# Patient Record
Sex: Male | Born: 1950
Health system: Southern US, Community
[De-identification: ages and names within clinical notes are randomized; demographics above are authoritative.]

## PROBLEM LIST (undated history)

## (undated) DIAGNOSIS — B029 Zoster without complications: Secondary | ICD-10-CM

## (undated) DIAGNOSIS — E663 Overweight: Secondary | ICD-10-CM

## (undated) DIAGNOSIS — E785 Hyperlipidemia, unspecified: Secondary | ICD-10-CM

## (undated) DIAGNOSIS — G63 Polyneuropathy in diseases classified elsewhere: Secondary | ICD-10-CM

## (undated) DIAGNOSIS — Z6828 Body mass index (BMI) 28.0-28.9, adult: Secondary | ICD-10-CM

## (undated) DIAGNOSIS — E1169 Type 2 diabetes mellitus with other specified complication: Secondary | ICD-10-CM

## (undated) HISTORY — PX: BACK SURGERY: SHX140

## (undated) HISTORY — PX: CHOLECYSTECTOMY: SHX55

## (undated) HISTORY — PX: HERNIA REPAIR: SHX51

## (undated) HISTORY — DX: Overweight: E66.3

## (undated) HISTORY — DX: Zoster without complications: B02.9

## (undated) HISTORY — DX: Type 2 diabetes mellitus with other specified complication: E11.69

## (undated) HISTORY — DX: Hyperlipidemia, unspecified: E78.5

## (undated) HISTORY — DX: Body mass index (BMI) 28.0-28.9, adult: Z68.28

## (undated) HISTORY — DX: Polyneuropathy in diseases classified elsewhere: G63

---

## 2013-08-21 DIAGNOSIS — Z125 Encounter for screening for malignant neoplasm of prostate: Secondary | ICD-10-CM | POA: Diagnosis not present

## 2013-08-21 DIAGNOSIS — Z79899 Other long term (current) drug therapy: Secondary | ICD-10-CM | POA: Diagnosis not present

## 2013-08-21 DIAGNOSIS — I10 Essential (primary) hypertension: Secondary | ICD-10-CM | POA: Diagnosis not present

## 2013-08-21 DIAGNOSIS — E7800 Pure hypercholesterolemia, unspecified: Secondary | ICD-10-CM | POA: Diagnosis not present

## 2013-08-21 DIAGNOSIS — E119 Type 2 diabetes mellitus without complications: Secondary | ICD-10-CM | POA: Diagnosis not present

## 2014-01-08 DIAGNOSIS — H109 Unspecified conjunctivitis: Secondary | ICD-10-CM | POA: Diagnosis not present

## 2014-09-18 DIAGNOSIS — E119 Type 2 diabetes mellitus without complications: Secondary | ICD-10-CM | POA: Diagnosis not present

## 2014-09-18 DIAGNOSIS — E1169 Type 2 diabetes mellitus with other specified complication: Secondary | ICD-10-CM | POA: Diagnosis not present

## 2014-09-18 DIAGNOSIS — Z0000 Encounter for general adult medical examination without abnormal findings: Secondary | ICD-10-CM | POA: Diagnosis not present

## 2014-09-18 DIAGNOSIS — Z125 Encounter for screening for malignant neoplasm of prostate: Secondary | ICD-10-CM | POA: Diagnosis not present

## 2014-09-18 DIAGNOSIS — Z79899 Other long term (current) drug therapy: Secondary | ICD-10-CM | POA: Diagnosis not present

## 2014-09-18 DIAGNOSIS — I10 Essential (primary) hypertension: Secondary | ICD-10-CM | POA: Diagnosis not present

## 2014-09-18 DIAGNOSIS — Z23 Encounter for immunization: Secondary | ICD-10-CM | POA: Diagnosis not present

## 2014-09-18 DIAGNOSIS — E780 Pure hypercholesterolemia: Secondary | ICD-10-CM | POA: Diagnosis not present

## 2014-12-27 DIAGNOSIS — E780 Pure hypercholesterolemia: Secondary | ICD-10-CM | POA: Diagnosis not present

## 2014-12-27 DIAGNOSIS — E1169 Type 2 diabetes mellitus with other specified complication: Secondary | ICD-10-CM | POA: Diagnosis not present

## 2014-12-27 DIAGNOSIS — I10 Essential (primary) hypertension: Secondary | ICD-10-CM | POA: Diagnosis not present

## 2014-12-27 DIAGNOSIS — E1165 Type 2 diabetes mellitus with hyperglycemia: Secondary | ICD-10-CM | POA: Diagnosis not present

## 2015-05-13 DIAGNOSIS — E1169 Type 2 diabetes mellitus with other specified complication: Secondary | ICD-10-CM | POA: Diagnosis not present

## 2015-05-21 DIAGNOSIS — R531 Weakness: Secondary | ICD-10-CM | POA: Diagnosis not present

## 2015-05-21 DIAGNOSIS — E7800 Pure hypercholesterolemia, unspecified: Secondary | ICD-10-CM | POA: Diagnosis not present

## 2015-05-21 DIAGNOSIS — E1169 Type 2 diabetes mellitus with other specified complication: Secondary | ICD-10-CM | POA: Diagnosis not present

## 2015-05-21 DIAGNOSIS — I10 Essential (primary) hypertension: Secondary | ICD-10-CM | POA: Diagnosis not present

## 2015-05-21 DIAGNOSIS — E119 Type 2 diabetes mellitus without complications: Secondary | ICD-10-CM | POA: Diagnosis not present

## 2015-05-30 DIAGNOSIS — R0600 Dyspnea, unspecified: Secondary | ICD-10-CM | POA: Diagnosis not present

## 2015-08-29 DIAGNOSIS — E1165 Type 2 diabetes mellitus with hyperglycemia: Secondary | ICD-10-CM | POA: Diagnosis not present

## 2015-08-29 DIAGNOSIS — E785 Hyperlipidemia, unspecified: Secondary | ICD-10-CM | POA: Diagnosis not present

## 2015-08-29 DIAGNOSIS — R42 Dizziness and giddiness: Secondary | ICD-10-CM | POA: Diagnosis not present

## 2015-08-29 DIAGNOSIS — I10 Essential (primary) hypertension: Secondary | ICD-10-CM | POA: Diagnosis not present

## 2015-08-29 DIAGNOSIS — Z79899 Other long term (current) drug therapy: Secondary | ICD-10-CM | POA: Diagnosis not present

## 2015-10-28 DIAGNOSIS — E663 Overweight: Secondary | ICD-10-CM | POA: Diagnosis not present

## 2015-10-28 DIAGNOSIS — Z79899 Other long term (current) drug therapy: Secondary | ICD-10-CM | POA: Diagnosis not present

## 2015-10-28 DIAGNOSIS — E785 Hyperlipidemia, unspecified: Secondary | ICD-10-CM | POA: Diagnosis not present

## 2015-10-28 DIAGNOSIS — Z125 Encounter for screening for malignant neoplasm of prostate: Secondary | ICD-10-CM | POA: Diagnosis not present

## 2015-10-28 DIAGNOSIS — E1169 Type 2 diabetes mellitus with other specified complication: Secondary | ICD-10-CM | POA: Diagnosis not present

## 2015-10-28 DIAGNOSIS — R5383 Other fatigue: Secondary | ICD-10-CM | POA: Diagnosis not present

## 2015-10-28 DIAGNOSIS — D519 Vitamin B12 deficiency anemia, unspecified: Secondary | ICD-10-CM | POA: Diagnosis not present

## 2015-10-28 DIAGNOSIS — Z0000 Encounter for general adult medical examination without abnormal findings: Secondary | ICD-10-CM | POA: Diagnosis not present

## 2015-10-28 DIAGNOSIS — I10 Essential (primary) hypertension: Secondary | ICD-10-CM | POA: Diagnosis not present

## 2016-03-20 DIAGNOSIS — Z8719 Personal history of other diseases of the digestive system: Secondary | ICD-10-CM | POA: Diagnosis not present

## 2016-03-20 DIAGNOSIS — Z9889 Other specified postprocedural states: Secondary | ICD-10-CM | POA: Diagnosis not present

## 2016-03-20 DIAGNOSIS — Z23 Encounter for immunization: Secondary | ICD-10-CM | POA: Diagnosis not present

## 2016-03-20 DIAGNOSIS — R1032 Left lower quadrant pain: Secondary | ICD-10-CM | POA: Diagnosis not present

## 2016-03-20 DIAGNOSIS — M1711 Unilateral primary osteoarthritis, right knee: Secondary | ICD-10-CM | POA: Diagnosis not present

## 2016-04-06 DIAGNOSIS — R1032 Left lower quadrant pain: Secondary | ICD-10-CM | POA: Diagnosis not present

## 2016-04-06 HISTORY — DX: Left lower quadrant pain: R10.32

## 2016-06-11 DIAGNOSIS — R1032 Left lower quadrant pain: Secondary | ICD-10-CM | POA: Diagnosis not present

## 2016-06-11 DIAGNOSIS — Z1389 Encounter for screening for other disorder: Secondary | ICD-10-CM | POA: Diagnosis not present

## 2016-06-11 DIAGNOSIS — E785 Hyperlipidemia, unspecified: Secondary | ICD-10-CM | POA: Diagnosis not present

## 2016-06-11 DIAGNOSIS — Z9181 History of falling: Secondary | ICD-10-CM | POA: Diagnosis not present

## 2016-06-18 DIAGNOSIS — M5136 Other intervertebral disc degeneration, lumbar region: Secondary | ICD-10-CM | POA: Diagnosis not present

## 2016-06-18 DIAGNOSIS — R933 Abnormal findings on diagnostic imaging of other parts of digestive tract: Secondary | ICD-10-CM | POA: Diagnosis not present

## 2016-06-18 DIAGNOSIS — I700 Atherosclerosis of aorta: Secondary | ICD-10-CM | POA: Diagnosis not present

## 2016-06-18 DIAGNOSIS — R1032 Left lower quadrant pain: Secondary | ICD-10-CM | POA: Diagnosis not present

## 2016-06-18 DIAGNOSIS — K5730 Diverticulosis of large intestine without perforation or abscess without bleeding: Secondary | ICD-10-CM | POA: Diagnosis not present

## 2016-06-18 DIAGNOSIS — I2510 Atherosclerotic heart disease of native coronary artery without angina pectoris: Secondary | ICD-10-CM | POA: Diagnosis not present

## 2016-06-18 DIAGNOSIS — N200 Calculus of kidney: Secondary | ICD-10-CM | POA: Diagnosis not present

## 2016-06-18 DIAGNOSIS — M47896 Other spondylosis, lumbar region: Secondary | ICD-10-CM | POA: Diagnosis not present

## 2016-06-23 DIAGNOSIS — K6389 Other specified diseases of intestine: Secondary | ICD-10-CM

## 2016-06-23 DIAGNOSIS — K639 Disease of intestine, unspecified: Secondary | ICD-10-CM | POA: Diagnosis not present

## 2016-06-23 DIAGNOSIS — R1032 Left lower quadrant pain: Secondary | ICD-10-CM | POA: Diagnosis not present

## 2016-06-23 HISTORY — DX: Other specified diseases of intestine: K63.89

## 2016-06-25 DIAGNOSIS — Z9049 Acquired absence of other specified parts of digestive tract: Secondary | ICD-10-CM | POA: Diagnosis not present

## 2016-06-25 DIAGNOSIS — Z87891 Personal history of nicotine dependence: Secondary | ICD-10-CM | POA: Diagnosis not present

## 2016-06-25 DIAGNOSIS — Z7982 Long term (current) use of aspirin: Secondary | ICD-10-CM | POA: Diagnosis not present

## 2016-06-25 DIAGNOSIS — E7800 Pure hypercholesterolemia, unspecified: Secondary | ICD-10-CM | POA: Diagnosis not present

## 2016-06-25 DIAGNOSIS — I10 Essential (primary) hypertension: Secondary | ICD-10-CM | POA: Diagnosis not present

## 2016-06-25 DIAGNOSIS — Z79899 Other long term (current) drug therapy: Secondary | ICD-10-CM | POA: Diagnosis not present

## 2016-06-25 DIAGNOSIS — C184 Malignant neoplasm of transverse colon: Secondary | ICD-10-CM | POA: Diagnosis not present

## 2016-06-25 DIAGNOSIS — K5730 Diverticulosis of large intestine without perforation or abscess without bleeding: Secondary | ICD-10-CM | POA: Diagnosis not present

## 2016-06-25 DIAGNOSIS — C189 Malignant neoplasm of colon, unspecified: Secondary | ICD-10-CM | POA: Diagnosis not present

## 2016-06-25 DIAGNOSIS — E119 Type 2 diabetes mellitus without complications: Secondary | ICD-10-CM | POA: Diagnosis not present

## 2016-06-25 DIAGNOSIS — Z7984 Long term (current) use of oral hypoglycemic drugs: Secondary | ICD-10-CM | POA: Diagnosis not present

## 2016-07-01 DIAGNOSIS — D500 Iron deficiency anemia secondary to blood loss (chronic): Secondary | ICD-10-CM | POA: Insufficient documentation

## 2016-07-01 HISTORY — DX: Iron deficiency anemia secondary to blood loss (chronic): D50.0

## 2016-07-07 DIAGNOSIS — C189 Malignant neoplasm of colon, unspecified: Secondary | ICD-10-CM | POA: Diagnosis not present

## 2016-07-07 DIAGNOSIS — C184 Malignant neoplasm of transverse colon: Secondary | ICD-10-CM

## 2016-07-07 HISTORY — DX: Malignant neoplasm of transverse colon: C18.4

## 2016-07-09 DIAGNOSIS — R918 Other nonspecific abnormal finding of lung field: Secondary | ICD-10-CM | POA: Diagnosis not present

## 2016-07-09 DIAGNOSIS — C189 Malignant neoplasm of colon, unspecified: Secondary | ICD-10-CM | POA: Diagnosis not present

## 2016-07-15 DIAGNOSIS — E785 Hyperlipidemia, unspecified: Secondary | ICD-10-CM | POA: Diagnosis present

## 2016-07-15 DIAGNOSIS — R410 Disorientation, unspecified: Secondary | ICD-10-CM | POA: Diagnosis present

## 2016-07-15 DIAGNOSIS — C772 Secondary and unspecified malignant neoplasm of intra-abdominal lymph nodes: Secondary | ICD-10-CM | POA: Diagnosis not present

## 2016-07-15 DIAGNOSIS — C184 Malignant neoplasm of transverse colon: Secondary | ICD-10-CM | POA: Diagnosis not present

## 2016-07-15 DIAGNOSIS — Z79899 Other long term (current) drug therapy: Secondary | ICD-10-CM | POA: Diagnosis not present

## 2016-07-15 DIAGNOSIS — Z4682 Encounter for fitting and adjustment of non-vascular catheter: Secondary | ICD-10-CM | POA: Diagnosis not present

## 2016-07-15 DIAGNOSIS — Z87891 Personal history of nicotine dependence: Secondary | ICD-10-CM | POA: Diagnosis not present

## 2016-07-15 DIAGNOSIS — Z01810 Encounter for preprocedural cardiovascular examination: Secondary | ICD-10-CM | POA: Diagnosis not present

## 2016-07-15 DIAGNOSIS — E119 Type 2 diabetes mellitus without complications: Secondary | ICD-10-CM | POA: Diagnosis not present

## 2016-07-15 DIAGNOSIS — T426X5A Adverse effect of other antiepileptic and sedative-hypnotic drugs, initial encounter: Secondary | ICD-10-CM | POA: Diagnosis present

## 2016-07-15 DIAGNOSIS — C183 Malignant neoplasm of hepatic flexure: Secondary | ICD-10-CM | POA: Diagnosis not present

## 2016-07-15 DIAGNOSIS — K567 Ileus, unspecified: Secondary | ICD-10-CM | POA: Diagnosis not present

## 2016-07-15 DIAGNOSIS — I10 Essential (primary) hypertension: Secondary | ICD-10-CM | POA: Diagnosis not present

## 2016-07-15 DIAGNOSIS — K219 Gastro-esophageal reflux disease without esophagitis: Secondary | ICD-10-CM | POA: Diagnosis present

## 2016-07-15 DIAGNOSIS — Z7982 Long term (current) use of aspirin: Secondary | ICD-10-CM | POA: Diagnosis not present

## 2016-07-15 DIAGNOSIS — Z7984 Long term (current) use of oral hypoglycemic drugs: Secondary | ICD-10-CM | POA: Diagnosis not present

## 2016-07-23 DIAGNOSIS — M6281 Muscle weakness (generalized): Secondary | ICD-10-CM | POA: Diagnosis not present

## 2016-07-23 DIAGNOSIS — E860 Dehydration: Secondary | ICD-10-CM | POA: Diagnosis not present

## 2016-07-23 DIAGNOSIS — R197 Diarrhea, unspecified: Secondary | ICD-10-CM | POA: Diagnosis not present

## 2016-07-28 DIAGNOSIS — Z09 Encounter for follow-up examination after completed treatment for conditions other than malignant neoplasm: Secondary | ICD-10-CM

## 2016-07-28 HISTORY — DX: Encounter for follow-up examination after completed treatment for conditions other than malignant neoplasm: Z09

## 2016-08-07 DIAGNOSIS — C189 Malignant neoplasm of colon, unspecified: Secondary | ICD-10-CM | POA: Diagnosis not present

## 2016-08-07 DIAGNOSIS — C184 Malignant neoplasm of transverse colon: Secondary | ICD-10-CM | POA: Diagnosis not present

## 2016-08-07 DIAGNOSIS — D509 Iron deficiency anemia, unspecified: Secondary | ICD-10-CM | POA: Diagnosis not present

## 2016-08-11 DIAGNOSIS — C184 Malignant neoplasm of transverse colon: Secondary | ICD-10-CM | POA: Diagnosis not present

## 2016-08-14 DIAGNOSIS — Z7984 Long term (current) use of oral hypoglycemic drugs: Secondary | ICD-10-CM | POA: Diagnosis not present

## 2016-08-14 DIAGNOSIS — E119 Type 2 diabetes mellitus without complications: Secondary | ICD-10-CM | POA: Diagnosis not present

## 2016-08-14 DIAGNOSIS — I10 Essential (primary) hypertension: Secondary | ICD-10-CM | POA: Diagnosis not present

## 2016-08-14 DIAGNOSIS — C183 Malignant neoplasm of hepatic flexure: Secondary | ICD-10-CM | POA: Diagnosis not present

## 2016-08-14 DIAGNOSIS — C184 Malignant neoplasm of transverse colon: Secondary | ICD-10-CM | POA: Diagnosis not present

## 2016-08-14 DIAGNOSIS — C189 Malignant neoplasm of colon, unspecified: Secondary | ICD-10-CM | POA: Diagnosis not present

## 2016-08-14 DIAGNOSIS — Z452 Encounter for adjustment and management of vascular access device: Secondary | ICD-10-CM | POA: Diagnosis not present

## 2016-08-14 DIAGNOSIS — Z79899 Other long term (current) drug therapy: Secondary | ICD-10-CM | POA: Diagnosis not present

## 2016-08-19 DIAGNOSIS — Z5111 Encounter for antineoplastic chemotherapy: Secondary | ICD-10-CM | POA: Diagnosis not present

## 2016-08-19 DIAGNOSIS — C184 Malignant neoplasm of transverse colon: Secondary | ICD-10-CM | POA: Diagnosis not present

## 2016-08-21 DIAGNOSIS — Z452 Encounter for adjustment and management of vascular access device: Secondary | ICD-10-CM | POA: Diagnosis not present

## 2016-08-21 DIAGNOSIS — C184 Malignant neoplasm of transverse colon: Secondary | ICD-10-CM | POA: Diagnosis not present

## 2016-08-26 DIAGNOSIS — C184 Malignant neoplasm of transverse colon: Secondary | ICD-10-CM | POA: Diagnosis not present

## 2016-09-02 DIAGNOSIS — D649 Anemia, unspecified: Secondary | ICD-10-CM | POA: Diagnosis not present

## 2016-09-02 DIAGNOSIS — C184 Malignant neoplasm of transverse colon: Secondary | ICD-10-CM | POA: Diagnosis not present

## 2016-09-02 DIAGNOSIS — D500 Iron deficiency anemia secondary to blood loss (chronic): Secondary | ICD-10-CM | POA: Diagnosis not present

## 2016-09-02 DIAGNOSIS — C189 Malignant neoplasm of colon, unspecified: Secondary | ICD-10-CM | POA: Diagnosis not present

## 2016-09-04 DIAGNOSIS — Z452 Encounter for adjustment and management of vascular access device: Secondary | ICD-10-CM | POA: Diagnosis not present

## 2016-09-04 DIAGNOSIS — C184 Malignant neoplasm of transverse colon: Secondary | ICD-10-CM | POA: Diagnosis not present

## 2016-09-15 DIAGNOSIS — R197 Diarrhea, unspecified: Secondary | ICD-10-CM | POA: Diagnosis not present

## 2016-09-15 DIAGNOSIS — C779 Secondary and unspecified malignant neoplasm of lymph node, unspecified: Secondary | ICD-10-CM | POA: Diagnosis not present

## 2016-09-15 DIAGNOSIS — R109 Unspecified abdominal pain: Secondary | ICD-10-CM | POA: Diagnosis not present

## 2016-09-15 DIAGNOSIS — C184 Malignant neoplasm of transverse colon: Secondary | ICD-10-CM | POA: Diagnosis not present

## 2016-09-15 DIAGNOSIS — C182 Malignant neoplasm of ascending colon: Secondary | ICD-10-CM | POA: Diagnosis not present

## 2016-09-15 DIAGNOSIS — C786 Secondary malignant neoplasm of retroperitoneum and peritoneum: Secondary | ICD-10-CM | POA: Diagnosis not present

## 2016-09-15 DIAGNOSIS — R110 Nausea: Secondary | ICD-10-CM | POA: Diagnosis not present

## 2016-09-17 DIAGNOSIS — D500 Iron deficiency anemia secondary to blood loss (chronic): Secondary | ICD-10-CM | POA: Diagnosis not present

## 2016-09-17 DIAGNOSIS — C184 Malignant neoplasm of transverse colon: Secondary | ICD-10-CM | POA: Diagnosis not present

## 2016-09-17 DIAGNOSIS — Z452 Encounter for adjustment and management of vascular access device: Secondary | ICD-10-CM | POA: Diagnosis not present

## 2016-09-30 DIAGNOSIS — C778 Secondary and unspecified malignant neoplasm of lymph nodes of multiple regions: Secondary | ICD-10-CM | POA: Diagnosis not present

## 2016-09-30 DIAGNOSIS — C184 Malignant neoplasm of transverse colon: Secondary | ICD-10-CM | POA: Diagnosis not present

## 2016-10-02 DIAGNOSIS — C184 Malignant neoplasm of transverse colon: Secondary | ICD-10-CM | POA: Diagnosis not present

## 2016-10-02 DIAGNOSIS — C778 Secondary and unspecified malignant neoplasm of lymph nodes of multiple regions: Secondary | ICD-10-CM | POA: Diagnosis not present

## 2016-10-02 DIAGNOSIS — Z452 Encounter for adjustment and management of vascular access device: Secondary | ICD-10-CM | POA: Diagnosis not present

## 2016-10-14 DIAGNOSIS — C184 Malignant neoplasm of transverse colon: Secondary | ICD-10-CM | POA: Diagnosis not present

## 2016-10-16 DIAGNOSIS — C184 Malignant neoplasm of transverse colon: Secondary | ICD-10-CM | POA: Diagnosis not present

## 2016-10-16 DIAGNOSIS — Z452 Encounter for adjustment and management of vascular access device: Secondary | ICD-10-CM | POA: Diagnosis not present

## 2016-10-28 DIAGNOSIS — D509 Iron deficiency anemia, unspecified: Secondary | ICD-10-CM | POA: Diagnosis not present

## 2016-10-28 DIAGNOSIS — C184 Malignant neoplasm of transverse colon: Secondary | ICD-10-CM | POA: Diagnosis not present

## 2016-10-30 DIAGNOSIS — D509 Iron deficiency anemia, unspecified: Secondary | ICD-10-CM | POA: Diagnosis not present

## 2016-10-30 DIAGNOSIS — Z452 Encounter for adjustment and management of vascular access device: Secondary | ICD-10-CM | POA: Diagnosis not present

## 2016-10-30 DIAGNOSIS — C184 Malignant neoplasm of transverse colon: Secondary | ICD-10-CM | POA: Diagnosis not present

## 2016-11-06 DIAGNOSIS — Z23 Encounter for immunization: Secondary | ICD-10-CM | POA: Diagnosis not present

## 2016-11-06 DIAGNOSIS — Z125 Encounter for screening for malignant neoplasm of prostate: Secondary | ICD-10-CM | POA: Diagnosis not present

## 2016-11-06 DIAGNOSIS — Z0000 Encounter for general adult medical examination without abnormal findings: Secondary | ICD-10-CM | POA: Diagnosis not present

## 2016-11-06 DIAGNOSIS — E1169 Type 2 diabetes mellitus with other specified complication: Secondary | ICD-10-CM | POA: Diagnosis not present

## 2016-11-06 DIAGNOSIS — Z79899 Other long term (current) drug therapy: Secondary | ICD-10-CM | POA: Diagnosis not present

## 2016-11-06 DIAGNOSIS — I10 Essential (primary) hypertension: Secondary | ICD-10-CM | POA: Diagnosis not present

## 2016-11-06 DIAGNOSIS — E785 Hyperlipidemia, unspecified: Secondary | ICD-10-CM | POA: Diagnosis not present

## 2016-11-06 DIAGNOSIS — C189 Malignant neoplasm of colon, unspecified: Secondary | ICD-10-CM | POA: Diagnosis not present

## 2016-11-11 DIAGNOSIS — C184 Malignant neoplasm of transverse colon: Secondary | ICD-10-CM | POA: Diagnosis not present

## 2016-11-11 DIAGNOSIS — C189 Malignant neoplasm of colon, unspecified: Secondary | ICD-10-CM | POA: Diagnosis not present

## 2016-11-11 DIAGNOSIS — D6959 Other secondary thrombocytopenia: Secondary | ICD-10-CM | POA: Diagnosis not present

## 2016-11-18 DIAGNOSIS — Z5111 Encounter for antineoplastic chemotherapy: Secondary | ICD-10-CM | POA: Diagnosis not present

## 2016-11-18 DIAGNOSIS — C184 Malignant neoplasm of transverse colon: Secondary | ICD-10-CM | POA: Diagnosis not present

## 2016-11-18 DIAGNOSIS — D500 Iron deficiency anemia secondary to blood loss (chronic): Secondary | ICD-10-CM | POA: Diagnosis not present

## 2016-11-20 DIAGNOSIS — D500 Iron deficiency anemia secondary to blood loss (chronic): Secondary | ICD-10-CM | POA: Diagnosis not present

## 2016-11-20 DIAGNOSIS — C184 Malignant neoplasm of transverse colon: Secondary | ICD-10-CM | POA: Diagnosis not present

## 2016-11-20 DIAGNOSIS — Z452 Encounter for adjustment and management of vascular access device: Secondary | ICD-10-CM | POA: Diagnosis not present

## 2016-12-02 DIAGNOSIS — C184 Malignant neoplasm of transverse colon: Secondary | ICD-10-CM | POA: Diagnosis not present

## 2016-12-04 DIAGNOSIS — Z452 Encounter for adjustment and management of vascular access device: Secondary | ICD-10-CM | POA: Diagnosis not present

## 2016-12-04 DIAGNOSIS — C184 Malignant neoplasm of transverse colon: Secondary | ICD-10-CM | POA: Diagnosis not present

## 2016-12-15 DIAGNOSIS — C184 Malignant neoplasm of transverse colon: Secondary | ICD-10-CM | POA: Diagnosis not present

## 2016-12-30 DIAGNOSIS — D61810 Antineoplastic chemotherapy induced pancytopenia: Secondary | ICD-10-CM | POA: Diagnosis not present

## 2016-12-30 DIAGNOSIS — D708 Other neutropenia: Secondary | ICD-10-CM | POA: Diagnosis not present

## 2016-12-30 DIAGNOSIS — C189 Malignant neoplasm of colon, unspecified: Secondary | ICD-10-CM | POA: Diagnosis not present

## 2016-12-30 DIAGNOSIS — D649 Anemia, unspecified: Secondary | ICD-10-CM | POA: Diagnosis not present

## 2016-12-30 DIAGNOSIS — C184 Malignant neoplasm of transverse colon: Secondary | ICD-10-CM | POA: Diagnosis not present

## 2017-01-01 DIAGNOSIS — C184 Malignant neoplasm of transverse colon: Secondary | ICD-10-CM | POA: Diagnosis not present

## 2017-01-01 DIAGNOSIS — Z452 Encounter for adjustment and management of vascular access device: Secondary | ICD-10-CM | POA: Diagnosis not present

## 2017-01-13 DIAGNOSIS — C184 Malignant neoplasm of transverse colon: Secondary | ICD-10-CM | POA: Diagnosis not present

## 2017-01-15 DIAGNOSIS — Z452 Encounter for adjustment and management of vascular access device: Secondary | ICD-10-CM | POA: Diagnosis not present

## 2017-01-15 DIAGNOSIS — C184 Malignant neoplasm of transverse colon: Secondary | ICD-10-CM | POA: Diagnosis not present

## 2017-01-26 DIAGNOSIS — D696 Thrombocytopenia, unspecified: Secondary | ICD-10-CM | POA: Diagnosis not present

## 2017-01-26 DIAGNOSIS — G620 Drug-induced polyneuropathy: Secondary | ICD-10-CM | POA: Diagnosis not present

## 2017-01-26 DIAGNOSIS — C184 Malignant neoplasm of transverse colon: Secondary | ICD-10-CM | POA: Diagnosis not present

## 2017-01-26 DIAGNOSIS — C189 Malignant neoplasm of colon, unspecified: Secondary | ICD-10-CM | POA: Diagnosis not present

## 2017-01-26 DIAGNOSIS — D72818 Other decreased white blood cell count: Secondary | ICD-10-CM | POA: Diagnosis not present

## 2017-01-26 DIAGNOSIS — T451X5A Adverse effect of antineoplastic and immunosuppressive drugs, initial encounter: Secondary | ICD-10-CM | POA: Diagnosis not present

## 2017-01-26 DIAGNOSIS — D701 Agranulocytosis secondary to cancer chemotherapy: Secondary | ICD-10-CM | POA: Diagnosis not present

## 2017-01-28 DIAGNOSIS — C184 Malignant neoplasm of transverse colon: Secondary | ICD-10-CM | POA: Diagnosis not present

## 2017-01-28 DIAGNOSIS — Z452 Encounter for adjustment and management of vascular access device: Secondary | ICD-10-CM | POA: Diagnosis not present

## 2017-02-23 DIAGNOSIS — Z85038 Personal history of other malignant neoplasm of large intestine: Secondary | ICD-10-CM | POA: Diagnosis not present

## 2017-02-23 DIAGNOSIS — D72819 Decreased white blood cell count, unspecified: Secondary | ICD-10-CM | POA: Diagnosis not present

## 2017-02-23 DIAGNOSIS — G620 Drug-induced polyneuropathy: Secondary | ICD-10-CM | POA: Diagnosis not present

## 2017-02-23 DIAGNOSIS — D509 Iron deficiency anemia, unspecified: Secondary | ICD-10-CM | POA: Diagnosis not present

## 2017-02-23 DIAGNOSIS — D696 Thrombocytopenia, unspecified: Secondary | ICD-10-CM | POA: Diagnosis not present

## 2017-03-25 DIAGNOSIS — Z23 Encounter for immunization: Secondary | ICD-10-CM | POA: Diagnosis not present

## 2017-03-25 DIAGNOSIS — D696 Thrombocytopenia, unspecified: Secondary | ICD-10-CM | POA: Diagnosis not present

## 2017-03-25 DIAGNOSIS — D509 Iron deficiency anemia, unspecified: Secondary | ICD-10-CM | POA: Diagnosis not present

## 2017-03-25 DIAGNOSIS — E611 Iron deficiency: Secondary | ICD-10-CM | POA: Diagnosis not present

## 2017-03-25 DIAGNOSIS — Z85038 Personal history of other malignant neoplasm of large intestine: Secondary | ICD-10-CM | POA: Diagnosis not present

## 2017-03-25 DIAGNOSIS — T451X5A Adverse effect of antineoplastic and immunosuppressive drugs, initial encounter: Secondary | ICD-10-CM | POA: Diagnosis not present

## 2017-03-25 DIAGNOSIS — G620 Drug-induced polyneuropathy: Secondary | ICD-10-CM | POA: Diagnosis not present

## 2017-04-26 DIAGNOSIS — E1169 Type 2 diabetes mellitus with other specified complication: Secondary | ICD-10-CM | POA: Diagnosis not present

## 2017-04-26 DIAGNOSIS — E785 Hyperlipidemia, unspecified: Secondary | ICD-10-CM | POA: Diagnosis not present

## 2017-04-26 DIAGNOSIS — R600 Localized edema: Secondary | ICD-10-CM | POA: Diagnosis not present

## 2017-04-26 DIAGNOSIS — R0600 Dyspnea, unspecified: Secondary | ICD-10-CM | POA: Diagnosis not present

## 2017-04-30 DIAGNOSIS — R0600 Dyspnea, unspecified: Secondary | ICD-10-CM | POA: Diagnosis not present

## 2017-04-30 DIAGNOSIS — R0602 Shortness of breath: Secondary | ICD-10-CM | POA: Diagnosis not present

## 2017-06-25 DIAGNOSIS — C184 Malignant neoplasm of transverse colon: Secondary | ICD-10-CM | POA: Diagnosis not present

## 2017-06-25 DIAGNOSIS — M1712 Unilateral primary osteoarthritis, left knee: Secondary | ICD-10-CM | POA: Diagnosis not present

## 2017-06-25 DIAGNOSIS — M25562 Pain in left knee: Secondary | ICD-10-CM | POA: Diagnosis not present

## 2017-06-28 DIAGNOSIS — C184 Malignant neoplasm of transverse colon: Secondary | ICD-10-CM | POA: Diagnosis not present

## 2017-07-06 DIAGNOSIS — Z01818 Encounter for other preprocedural examination: Secondary | ICD-10-CM | POA: Diagnosis not present

## 2017-07-06 DIAGNOSIS — E663 Overweight: Secondary | ICD-10-CM | POA: Diagnosis not present

## 2017-07-06 DIAGNOSIS — Z6829 Body mass index (BMI) 29.0-29.9, adult: Secondary | ICD-10-CM | POA: Diagnosis not present

## 2017-07-06 DIAGNOSIS — M1712 Unilateral primary osteoarthritis, left knee: Secondary | ICD-10-CM | POA: Diagnosis not present

## 2017-07-08 DIAGNOSIS — Z7984 Long term (current) use of oral hypoglycemic drugs: Secondary | ICD-10-CM | POA: Diagnosis not present

## 2017-07-08 DIAGNOSIS — K5730 Diverticulosis of large intestine without perforation or abscess without bleeding: Secondary | ICD-10-CM | POA: Diagnosis not present

## 2017-07-08 DIAGNOSIS — I10 Essential (primary) hypertension: Secondary | ICD-10-CM | POA: Diagnosis not present

## 2017-07-08 DIAGNOSIS — E119 Type 2 diabetes mellitus without complications: Secondary | ICD-10-CM | POA: Diagnosis not present

## 2017-07-08 DIAGNOSIS — Z980 Intestinal bypass and anastomosis status: Secondary | ICD-10-CM | POA: Diagnosis not present

## 2017-07-08 DIAGNOSIS — Z87891 Personal history of nicotine dependence: Secondary | ICD-10-CM | POA: Diagnosis not present

## 2017-07-08 DIAGNOSIS — E7800 Pure hypercholesterolemia, unspecified: Secondary | ICD-10-CM | POA: Diagnosis not present

## 2017-07-08 DIAGNOSIS — Z79899 Other long term (current) drug therapy: Secondary | ICD-10-CM | POA: Diagnosis not present

## 2017-07-08 DIAGNOSIS — Z7982 Long term (current) use of aspirin: Secondary | ICD-10-CM | POA: Diagnosis not present

## 2017-07-08 DIAGNOSIS — C184 Malignant neoplasm of transverse colon: Secondary | ICD-10-CM | POA: Diagnosis not present

## 2017-07-14 DIAGNOSIS — Z79899 Other long term (current) drug therapy: Secondary | ICD-10-CM | POA: Diagnosis not present

## 2017-07-14 DIAGNOSIS — Z01818 Encounter for other preprocedural examination: Secondary | ICD-10-CM | POA: Diagnosis not present

## 2017-07-14 DIAGNOSIS — M79609 Pain in unspecified limb: Secondary | ICD-10-CM | POA: Diagnosis not present

## 2017-07-14 DIAGNOSIS — R52 Pain, unspecified: Secondary | ICD-10-CM | POA: Diagnosis not present

## 2017-08-17 DIAGNOSIS — M1712 Unilateral primary osteoarthritis, left knee: Secondary | ICD-10-CM | POA: Diagnosis not present

## 2017-08-17 DIAGNOSIS — Z452 Encounter for adjustment and management of vascular access device: Secondary | ICD-10-CM | POA: Diagnosis not present

## 2017-08-17 DIAGNOSIS — C184 Malignant neoplasm of transverse colon: Secondary | ICD-10-CM | POA: Diagnosis not present

## 2017-08-17 HISTORY — DX: Encounter for adjustment and management of vascular access device: Z45.2

## 2017-08-23 DIAGNOSIS — M1711 Unilateral primary osteoarthritis, right knee: Secondary | ICD-10-CM | POA: Diagnosis not present

## 2017-08-27 DIAGNOSIS — Z79899 Other long term (current) drug therapy: Secondary | ICD-10-CM | POA: Diagnosis not present

## 2017-08-27 DIAGNOSIS — Z87891 Personal history of nicotine dependence: Secondary | ICD-10-CM | POA: Diagnosis not present

## 2017-08-27 DIAGNOSIS — Z7982 Long term (current) use of aspirin: Secondary | ICD-10-CM | POA: Diagnosis not present

## 2017-08-27 DIAGNOSIS — Z7984 Long term (current) use of oral hypoglycemic drugs: Secondary | ICD-10-CM | POA: Diagnosis not present

## 2017-08-27 DIAGNOSIS — Z85038 Personal history of other malignant neoplasm of large intestine: Secondary | ICD-10-CM | POA: Diagnosis not present

## 2017-08-27 DIAGNOSIS — Z452 Encounter for adjustment and management of vascular access device: Secondary | ICD-10-CM | POA: Diagnosis not present

## 2017-08-27 DIAGNOSIS — I2510 Atherosclerotic heart disease of native coronary artery without angina pectoris: Secondary | ICD-10-CM | POA: Diagnosis not present

## 2017-08-27 DIAGNOSIS — E119 Type 2 diabetes mellitus without complications: Secondary | ICD-10-CM | POA: Diagnosis not present

## 2017-08-27 DIAGNOSIS — E7800 Pure hypercholesterolemia, unspecified: Secondary | ICD-10-CM | POA: Diagnosis not present

## 2017-08-27 DIAGNOSIS — I10 Essential (primary) hypertension: Secondary | ICD-10-CM | POA: Diagnosis not present

## 2017-09-16 DIAGNOSIS — E785 Hyperlipidemia, unspecified: Secondary | ICD-10-CM | POA: Diagnosis not present

## 2017-09-16 DIAGNOSIS — E1169 Type 2 diabetes mellitus with other specified complication: Secondary | ICD-10-CM | POA: Diagnosis not present

## 2017-09-16 DIAGNOSIS — I10 Essential (primary) hypertension: Secondary | ICD-10-CM | POA: Diagnosis not present

## 2017-09-16 DIAGNOSIS — Z79899 Other long term (current) drug therapy: Secondary | ICD-10-CM | POA: Diagnosis not present

## 2017-09-16 DIAGNOSIS — N401 Enlarged prostate with lower urinary tract symptoms: Secondary | ICD-10-CM | POA: Diagnosis not present

## 2017-09-23 DIAGNOSIS — C184 Malignant neoplasm of transverse colon: Secondary | ICD-10-CM | POA: Diagnosis not present

## 2017-09-23 DIAGNOSIS — R970 Elevated carcinoembryonic antigen [CEA]: Secondary | ICD-10-CM | POA: Diagnosis not present

## 2017-09-23 DIAGNOSIS — D696 Thrombocytopenia, unspecified: Secondary | ICD-10-CM | POA: Diagnosis not present

## 2017-09-23 DIAGNOSIS — Z85038 Personal history of other malignant neoplasm of large intestine: Secondary | ICD-10-CM | POA: Diagnosis not present

## 2017-09-23 DIAGNOSIS — Z9221 Personal history of antineoplastic chemotherapy: Secondary | ICD-10-CM | POA: Diagnosis not present

## 2017-09-23 DIAGNOSIS — G620 Drug-induced polyneuropathy: Secondary | ICD-10-CM | POA: Diagnosis not present

## 2017-10-19 DIAGNOSIS — C184 Malignant neoplasm of transverse colon: Secondary | ICD-10-CM | POA: Diagnosis not present

## 2017-10-19 DIAGNOSIS — K5730 Diverticulosis of large intestine without perforation or abscess without bleeding: Secondary | ICD-10-CM | POA: Diagnosis not present

## 2017-10-19 DIAGNOSIS — C189 Malignant neoplasm of colon, unspecified: Secondary | ICD-10-CM | POA: Diagnosis not present

## 2017-10-19 DIAGNOSIS — R970 Elevated carcinoembryonic antigen [CEA]: Secondary | ICD-10-CM | POA: Diagnosis not present

## 2017-10-19 DIAGNOSIS — Z9049 Acquired absence of other specified parts of digestive tract: Secondary | ICD-10-CM | POA: Diagnosis not present

## 2017-10-19 DIAGNOSIS — I700 Atherosclerosis of aorta: Secondary | ICD-10-CM | POA: Diagnosis not present

## 2017-11-15 DIAGNOSIS — E785 Hyperlipidemia, unspecified: Secondary | ICD-10-CM | POA: Diagnosis not present

## 2017-11-15 DIAGNOSIS — I10 Essential (primary) hypertension: Secondary | ICD-10-CM | POA: Diagnosis not present

## 2017-11-15 DIAGNOSIS — E1169 Type 2 diabetes mellitus with other specified complication: Secondary | ICD-10-CM | POA: Diagnosis not present

## 2017-11-15 DIAGNOSIS — N401 Enlarged prostate with lower urinary tract symptoms: Secondary | ICD-10-CM | POA: Diagnosis not present

## 2017-11-15 DIAGNOSIS — Z0000 Encounter for general adult medical examination without abnormal findings: Secondary | ICD-10-CM | POA: Diagnosis not present

## 2017-11-23 DIAGNOSIS — M1711 Unilateral primary osteoarthritis, right knee: Secondary | ICD-10-CM | POA: Diagnosis not present

## 2017-11-26 DIAGNOSIS — M1712 Unilateral primary osteoarthritis, left knee: Secondary | ICD-10-CM | POA: Diagnosis not present

## 2017-12-21 DIAGNOSIS — D696 Thrombocytopenia, unspecified: Secondary | ICD-10-CM | POA: Diagnosis not present

## 2017-12-21 DIAGNOSIS — Z85038 Personal history of other malignant neoplasm of large intestine: Secondary | ICD-10-CM | POA: Diagnosis not present

## 2018-02-24 DIAGNOSIS — M1711 Unilateral primary osteoarthritis, right knee: Secondary | ICD-10-CM | POA: Diagnosis not present

## 2018-03-04 DIAGNOSIS — M1712 Unilateral primary osteoarthritis, left knee: Secondary | ICD-10-CM | POA: Diagnosis not present

## 2018-03-07 DIAGNOSIS — Z23 Encounter for immunization: Secondary | ICD-10-CM | POA: Diagnosis not present

## 2018-03-23 DIAGNOSIS — Z9221 Personal history of antineoplastic chemotherapy: Secondary | ICD-10-CM | POA: Diagnosis not present

## 2018-03-23 DIAGNOSIS — R970 Elevated carcinoembryonic antigen [CEA]: Secondary | ICD-10-CM | POA: Diagnosis not present

## 2018-03-23 DIAGNOSIS — G629 Polyneuropathy, unspecified: Secondary | ICD-10-CM | POA: Diagnosis not present

## 2018-03-23 DIAGNOSIS — M799 Soft tissue disorder, unspecified: Secondary | ICD-10-CM | POA: Diagnosis not present

## 2018-03-23 DIAGNOSIS — R935 Abnormal findings on diagnostic imaging of other abdominal regions, including retroperitoneum: Secondary | ICD-10-CM | POA: Diagnosis not present

## 2018-03-23 DIAGNOSIS — C184 Malignant neoplasm of transverse colon: Secondary | ICD-10-CM | POA: Diagnosis not present

## 2018-03-23 DIAGNOSIS — Z85038 Personal history of other malignant neoplasm of large intestine: Secondary | ICD-10-CM | POA: Diagnosis not present

## 2018-03-23 DIAGNOSIS — D696 Thrombocytopenia, unspecified: Secondary | ICD-10-CM | POA: Diagnosis not present

## 2018-03-31 DIAGNOSIS — C189 Malignant neoplasm of colon, unspecified: Secondary | ICD-10-CM | POA: Insufficient documentation

## 2018-03-31 DIAGNOSIS — C772 Secondary and unspecified malignant neoplasm of intra-abdominal lymph nodes: Secondary | ICD-10-CM | POA: Insufficient documentation

## 2018-03-31 HISTORY — DX: Malignant neoplasm of colon, unspecified: C18.9

## 2018-04-05 DIAGNOSIS — M5136 Other intervertebral disc degeneration, lumbar region: Secondary | ICD-10-CM | POA: Diagnosis not present

## 2018-04-05 DIAGNOSIS — Z85038 Personal history of other malignant neoplasm of large intestine: Secondary | ICD-10-CM | POA: Diagnosis not present

## 2018-04-05 DIAGNOSIS — R918 Other nonspecific abnormal finding of lung field: Secondary | ICD-10-CM | POA: Diagnosis not present

## 2018-04-05 DIAGNOSIS — I2510 Atherosclerotic heart disease of native coronary artery without angina pectoris: Secondary | ICD-10-CM | POA: Diagnosis not present

## 2018-04-05 DIAGNOSIS — C184 Malignant neoplasm of transverse colon: Secondary | ICD-10-CM | POA: Diagnosis not present

## 2018-04-05 DIAGNOSIS — K5790 Diverticulosis of intestine, part unspecified, without perforation or abscess without bleeding: Secondary | ICD-10-CM | POA: Diagnosis not present

## 2018-04-05 DIAGNOSIS — K5730 Diverticulosis of large intestine without perforation or abscess without bleeding: Secondary | ICD-10-CM | POA: Diagnosis not present

## 2018-04-05 DIAGNOSIS — I700 Atherosclerosis of aorta: Secondary | ICD-10-CM | POA: Diagnosis not present

## 2018-04-05 DIAGNOSIS — N281 Cyst of kidney, acquired: Secondary | ICD-10-CM | POA: Diagnosis not present

## 2018-04-05 DIAGNOSIS — R970 Elevated carcinoembryonic antigen [CEA]: Secondary | ICD-10-CM | POA: Diagnosis not present

## 2018-04-13 DIAGNOSIS — R935 Abnormal findings on diagnostic imaging of other abdominal regions, including retroperitoneum: Secondary | ICD-10-CM | POA: Diagnosis not present

## 2018-04-13 DIAGNOSIS — C184 Malignant neoplasm of transverse colon: Secondary | ICD-10-CM | POA: Diagnosis not present

## 2018-04-13 DIAGNOSIS — K668 Other specified disorders of peritoneum: Secondary | ICD-10-CM | POA: Diagnosis not present

## 2018-04-14 DIAGNOSIS — Z85038 Personal history of other malignant neoplasm of large intestine: Secondary | ICD-10-CM | POA: Diagnosis not present

## 2018-04-14 DIAGNOSIS — C184 Malignant neoplasm of transverse colon: Secondary | ICD-10-CM | POA: Diagnosis not present

## 2018-04-14 DIAGNOSIS — Z9221 Personal history of antineoplastic chemotherapy: Secondary | ICD-10-CM | POA: Diagnosis not present

## 2018-04-14 DIAGNOSIS — D481 Neoplasm of uncertain behavior of connective and other soft tissue: Secondary | ICD-10-CM | POA: Diagnosis not present

## 2018-04-15 DIAGNOSIS — C184 Malignant neoplasm of transverse colon: Secondary | ICD-10-CM | POA: Diagnosis not present

## 2018-04-15 DIAGNOSIS — K668 Other specified disorders of peritoneum: Secondary | ICD-10-CM | POA: Diagnosis not present

## 2018-04-15 HISTORY — DX: Other specified disorders of peritoneum: K66.8

## 2018-04-27 DIAGNOSIS — C772 Secondary and unspecified malignant neoplasm of intra-abdominal lymph nodes: Secondary | ICD-10-CM | POA: Diagnosis not present

## 2018-04-27 DIAGNOSIS — E7800 Pure hypercholesterolemia, unspecified: Secondary | ICD-10-CM | POA: Diagnosis not present

## 2018-04-27 DIAGNOSIS — Z87891 Personal history of nicotine dependence: Secondary | ICD-10-CM | POA: Diagnosis not present

## 2018-04-27 DIAGNOSIS — Z85038 Personal history of other malignant neoplasm of large intestine: Secondary | ICD-10-CM | POA: Diagnosis not present

## 2018-04-27 DIAGNOSIS — Z980 Intestinal bypass and anastomosis status: Secondary | ICD-10-CM | POA: Diagnosis not present

## 2018-04-27 DIAGNOSIS — E119 Type 2 diabetes mellitus without complications: Secondary | ICD-10-CM | POA: Diagnosis not present

## 2018-04-27 DIAGNOSIS — Z79899 Other long term (current) drug therapy: Secondary | ICD-10-CM | POA: Diagnosis not present

## 2018-04-27 DIAGNOSIS — C184 Malignant neoplasm of transverse colon: Secondary | ICD-10-CM | POA: Diagnosis not present

## 2018-04-27 DIAGNOSIS — I10 Essential (primary) hypertension: Secondary | ICD-10-CM | POA: Diagnosis not present

## 2018-04-27 DIAGNOSIS — K660 Peritoneal adhesions (postprocedural) (postinfection): Secondary | ICD-10-CM | POA: Diagnosis not present

## 2018-04-27 DIAGNOSIS — Z7984 Long term (current) use of oral hypoglycemic drugs: Secondary | ICD-10-CM | POA: Diagnosis not present

## 2018-04-27 DIAGNOSIS — C786 Secondary malignant neoplasm of retroperitoneum and peritoneum: Secondary | ICD-10-CM | POA: Diagnosis not present

## 2018-04-27 DIAGNOSIS — R1909 Other intra-abdominal and pelvic swelling, mass and lump: Secondary | ICD-10-CM | POA: Diagnosis not present

## 2018-05-18 DIAGNOSIS — C189 Malignant neoplasm of colon, unspecified: Secondary | ICD-10-CM | POA: Diagnosis not present

## 2018-05-18 DIAGNOSIS — Z9221 Personal history of antineoplastic chemotherapy: Secondary | ICD-10-CM | POA: Diagnosis not present

## 2018-05-18 DIAGNOSIS — C184 Malignant neoplasm of transverse colon: Secondary | ICD-10-CM | POA: Diagnosis not present

## 2018-05-18 DIAGNOSIS — C779 Secondary and unspecified malignant neoplasm of lymph node, unspecified: Secondary | ICD-10-CM | POA: Diagnosis not present

## 2018-05-26 DIAGNOSIS — M1711 Unilateral primary osteoarthritis, right knee: Secondary | ICD-10-CM | POA: Diagnosis not present

## 2018-06-03 DIAGNOSIS — M1712 Unilateral primary osteoarthritis, left knee: Secondary | ICD-10-CM | POA: Diagnosis not present

## 2018-08-15 DIAGNOSIS — J988 Other specified respiratory disorders: Secondary | ICD-10-CM | POA: Diagnosis not present

## 2018-08-17 DIAGNOSIS — D500 Iron deficiency anemia secondary to blood loss (chronic): Secondary | ICD-10-CM | POA: Diagnosis not present

## 2018-08-17 DIAGNOSIS — C184 Malignant neoplasm of transverse colon: Secondary | ICD-10-CM | POA: Diagnosis not present

## 2018-09-05 DIAGNOSIS — M1711 Unilateral primary osteoarthritis, right knee: Secondary | ICD-10-CM | POA: Diagnosis not present

## 2018-09-09 DIAGNOSIS — M1712 Unilateral primary osteoarthritis, left knee: Secondary | ICD-10-CM | POA: Diagnosis not present

## 2018-11-17 DIAGNOSIS — C184 Malignant neoplasm of transverse colon: Secondary | ICD-10-CM | POA: Diagnosis not present

## 2018-11-17 DIAGNOSIS — C775 Secondary and unspecified malignant neoplasm of intrapelvic lymph nodes: Secondary | ICD-10-CM | POA: Diagnosis not present

## 2018-12-08 DIAGNOSIS — E1169 Type 2 diabetes mellitus with other specified complication: Secondary | ICD-10-CM | POA: Diagnosis not present

## 2018-12-08 DIAGNOSIS — I10 Essential (primary) hypertension: Secondary | ICD-10-CM | POA: Diagnosis not present

## 2018-12-08 DIAGNOSIS — N401 Enlarged prostate with lower urinary tract symptoms: Secondary | ICD-10-CM | POA: Diagnosis not present

## 2018-12-08 DIAGNOSIS — R0600 Dyspnea, unspecified: Secondary | ICD-10-CM | POA: Diagnosis not present

## 2018-12-08 DIAGNOSIS — Z79899 Other long term (current) drug therapy: Secondary | ICD-10-CM | POA: Diagnosis not present

## 2018-12-08 DIAGNOSIS — Z0000 Encounter for general adult medical examination without abnormal findings: Secondary | ICD-10-CM | POA: Diagnosis not present

## 2018-12-08 DIAGNOSIS — N138 Other obstructive and reflux uropathy: Secondary | ICD-10-CM | POA: Diagnosis not present

## 2018-12-08 DIAGNOSIS — E785 Hyperlipidemia, unspecified: Secondary | ICD-10-CM | POA: Diagnosis not present

## 2018-12-12 ENCOUNTER — Other Ambulatory Visit: Payer: Self-pay

## 2018-12-12 ENCOUNTER — Ambulatory Visit (INDEPENDENT_AMBULATORY_CARE_PROVIDER_SITE_OTHER): Payer: Medicare Other | Admitting: Cardiology

## 2018-12-12 ENCOUNTER — Encounter: Payer: Self-pay | Admitting: Cardiology

## 2018-12-12 VITALS — BP 142/76 | HR 94 | Ht 68.0 in | Wt 183.0 lb

## 2018-12-12 DIAGNOSIS — I10 Essential (primary) hypertension: Secondary | ICD-10-CM | POA: Diagnosis not present

## 2018-12-12 DIAGNOSIS — R011 Cardiac murmur, unspecified: Secondary | ICD-10-CM

## 2018-12-12 DIAGNOSIS — R0789 Other chest pain: Secondary | ICD-10-CM

## 2018-12-12 DIAGNOSIS — Z85038 Personal history of other malignant neoplasm of large intestine: Secondary | ICD-10-CM

## 2018-12-12 DIAGNOSIS — E088 Diabetes mellitus due to underlying condition with unspecified complications: Secondary | ICD-10-CM | POA: Diagnosis not present

## 2018-12-12 DIAGNOSIS — E782 Mixed hyperlipidemia: Secondary | ICD-10-CM | POA: Diagnosis not present

## 2018-12-12 MED ORDER — NITROGLYCERIN 0.4 MG SL SUBL: 0.4000 mg | SUBLINGUAL_TABLET | SUBLINGUAL | 3 refills | Status: DC | PRN

## 2018-12-12 NOTE — Addendum Note (Signed)
Addended by: Beckey Rutter on: 12/12/2018 04:42 PM   Modules accepted: Orders

## 2018-12-12 NOTE — Patient Instructions (Addendum)
Medication Instructions:  Your physician has recommended you make the following change in your medication:  START taking nitroglycerin for chest tightness When having chest pain, stop what you are doing and sit down. Take 1 nitro, wait 5 minutes. Still having chest pain, take 1 nitro, wait 5 minutes. Still having chest pain, take 1 nitro, dial 911. Total of 3 nitro in 15 minutes.   If you need a refill on your cardiac medications before your next appointment, please call your pharmacy.   Lab work:   If you have labs (blood work) drawn today and your tests are completely normal, you will receive your results only by: Marland Kitchen MyChart Message (if you have MyChart) OR . A paper copy in the mail If you have any lab test that is abnormal or we need to change your treatment, we will call you to review the results.  Testing/Procedures: You had an EKG performed today  Your physician has requested that you have an echocardiogram. Echocardiography is a painless test that uses sound waves to create images of your heart. It provides your doctor with information about the size and shape of your heart and how well your heart's chambers and valves are working. This procedure takes approximately one hour. There are no restrictions for this procedure.  Your physician has requested that you have a lexiscan myoview. For further information please visit [email or web address]. Please follow instruction sheet, as given.    Follow-Up: At [facility], you and your health needs are our priority.  As part of our continuing mission to provide you with exceptional heart care, we have created designated Provider Care Teams.  These Care Teams include your primary Cardiologist (physician) and Advanced Practice Providers (APPs -  Physician Assistants and Nurse Practitioners) who all work together to provide you with the care you need, when you need it. You will need a follow up appointment in 1 months.     Any Other Special  Instructions Will Be Listed Below   Echocardiogram An echocardiogram is a procedure that uses painless sound waves (ultrasound) to produce an image of the heart. Images from an echocardiogram can provide important information about:  Signs of coronary artery disease (CAD).  Aneurysm detection. An aneurysm is a weak or damaged part of an artery wall that bulges out from the normal force of blood pumping through the body.  Heart size and shape. Changes in the size or shape of the heart can be associated with certain conditions, including heart failure, aneurysm, and CAD.  Heart muscle function.  Heart valve function.  Signs of a past heart attack.  Fluid buildup around the heart.  Thickening of the heart muscle.  A tumor or infectious growth around the heart valves. Tell a health care provider about:  Any allergies you have.  All medicines you are taking, including vitamins, herbs, eye drops, creams, and over-the-counter medicines.  Any blood disorders you have.  Any surgeries you have had.  Any medical conditions you have.  Whether you are pregnant or may be pregnant. What are the risks? Generally, this is a safe procedure. However, problems may occur, including:  Allergic reaction to dye (contrast) that may be used during the procedure. What happens before the procedure? No specific preparation is needed. You may eat and drink normally. What happens during the procedure?   An IV tube may be inserted into one of your veins.  You may receive contrast through this tube. A contrast is an injection that improves the quality  of the pictures from your heart.  A gel will be applied to your chest.  A wand-like tool (transducer) will be moved over your chest. The gel will help to transmit the sound waves from the transducer.  The sound waves will harmlessly bounce off of your heart to allow the heart images to be captured in real-time motion. The images will be recorded on  a computer. The procedure may vary among health care providers and hospitals. What happens after the procedure?  You may return to your normal, everyday life, including diet, activities, and medicines, unless your health care provider tells you not to do that. Summary  An echocardiogram is a procedure that uses painless sound waves (ultrasound) to produce an image of the heart.  Images from an echocardiogram can provide important information about the size and shape of your heart, heart muscle function, heart valve function, and fluid buildup around your heart.  You do not need to do anything to prepare before this procedure. You may eat and drink normally.  After the echocardiogram is completed, you may return to your normal, everyday life, unless your health care provider tells you not to do that. This information is not intended to replace advice given to you by your health care provider. Make sure you discuss any questions you have with your health care provider. Document Released: 05/29/2000 Document Revised: 09/22/2018 Document Reviewed: 07/04/2016 Elsevier Patient Education  [place] injection What is this medicine? REGADENOSON is used to test the heart for coronary artery disease. It is used in patients who can not exercise for their stress test. This medicine may be used for other purposes; ask your health care provider or pharmacist if you have questions. COMMON BRAND NAME(S): Lexiscan What should I tell my health care provider before I take this medicine? They need to know if you have any of these conditions:  heart problems  lung or breathing disease, like asthma or COPD  an unusual or allergic reaction to regadenoson, other medicines, foods, dyes, or preservatives  pregnant or trying to get pregnant  breast-feeding How should I use this medicine? This medicine is for injection into a vein. It is given by a health care professional in a hospital  or clinic setting. Talk to your pediatrician regarding the use of this medicine in children. Special care may be needed. Overdosage: If you think you have taken too much of this medicine contact a poison control center or emergency room at once. NOTE: This medicine is only for you. Do not share this medicine with others. What if I miss a dose? This does not apply. What may interact with this medicine?  caffeine  dipyridamole  guarana  theophylline This list may not describe all possible interactions. Give your health care provider a list of all the medicines, herbs, non-prescription drugs, or dietary supplements you use. Also tell them if you smoke, drink alcohol, or use illegal drugs. Some items may interact with your medicine. What should I watch for while using this medicine? Your condition will be monitored carefully while you are receiving this medicine. Do not take medicines, foods, or drinks with caffeine (like coffee, tea, or colas) for at least 12 hours before your test. If you do not know if something contains caffeine, ask your health care professional. What side effects may I notice from receiving this medicine? Side effects that you should report to your doctor or health care professional as soon as possible:  allergic reactions like  skin rash, itching or hives, swelling of the face, lips, or tongue  breathing problems  chest pain, tightness or palpitations  severe headache Side effects that usually do not require medical attention (report to your doctor or health care professional if they continue or are bothersome):  flushing  headache  irritation or pain at site where injected  nausea, vomiting This list may not describe all possible side effects. Call your doctor for medical advice about side effects. You may report side effects to FDA at 1-800-FDA-1088. Where should I keep my medicine? This drug is given in a hospital or clinic and will not be stored at  home. NOTE: This sheet is a summary. It may not cover all possible information. If you have questions about this medicine, talk to your doctor, pharmacist, or health care provider.  2020 Elsevier/Gold Standard (2008-01-30 15:08:13)   Cardiac Nuclear Scan A cardiac nuclear scan is a test that is done to check the flow of blood to your heart. It is done when you are resting and when you are exercising. The test looks for problems such as:  Not enough blood reaching a portion of the heart.  The heart muscle not working as it should. You may need this test if:  You have heart disease.  You have had lab results that are not normal.  You have had heart surgery or a balloon procedure to open up blocked arteries (angioplasty).  You have chest pain.  You have shortness of breath. In this test, a special dye (tracer) is put into your bloodstream. The tracer will travel to your heart. A camera will then take pictures of your heart to see how the tracer moves through your heart. This test is usually done at a hospital and takes 2-4 hours. Tell a doctor about:  Any allergies you have.  All medicines you are taking, including vitamins, herbs, eye drops, creams, and over-the-counter medicines.  Any problems you or family members have had with anesthetic medicines.  Any blood disorders you have.  Any surgeries you have had.  Any medical conditions you have.  Whether you are pregnant or may be pregnant. What are the risks? Generally, this is a safe test. However, problems may occur, such as:  Serious chest pain and heart attack. This is only a risk if the stress portion of the test is done.  Rapid heartbeat.  A feeling of warmth in your chest. This feeling usually does not last long.  Allergic reaction to the tracer. What happens before the test?  Ask your doctor about changing or stopping your normal medicines. This is important.  Follow instructions from your doctor about what  you cannot eat or drink.  Remove your jewelry on the day of the test. What happens during the test?  An IV tube will be inserted into one of your veins.  Your doctor will give you a small amount of tracer through the IV tube.  You will wait for 20-40 minutes while the tracer moves through your bloodstream.  Your heart will be monitored with an electrocardiogram (ECG).  You will lie down on an exam table.  Pictures of your heart will be taken for about 15-20 minutes.  You may also have a stress test. For this test, one of these things may be done: ? You will be asked to exercise on a treadmill or a stationary bike. ? You will be given medicines that will make your heart work harder. This is done if you  are unable to exercise.  When blood flow to your heart has peaked, a tracer will again be given through the IV tube.  After 20-40 minutes, you will get back on the exam table. More pictures will be taken of your heart.  Depending on the tracer that is used, more pictures may need to be taken 3-4 hours later.  Your IV tube will be removed when the test is over. The test may vary among doctors and hospitals. What happens after the test?  Ask your doctor: ? Whether you can return to your normal schedule, including diet, activities, and medicines. ? Whether you should drink more fluids. This will help to remove the tracer from your body. Drink enough fluid to keep your pee (urine) pale yellow.  Ask your doctor, or the department that is doing the test: ? When will my results be ready? ? How will I get my results? Summary  A cardiac nuclear scan is a test that is done to check the flow of blood to your heart.  Tell your doctor whether you are pregnant or may be pregnant.  Before the test, ask your doctor about changing or stopping your normal medicines. This is important.  Ask your doctor whether you can return to your normal activities. You may be asked to drink more  fluids. This information is not intended to replace advice given to you by your health care provider. Make sure you discuss any questions you have with your health care provider. Document Released: 11/15/2017 Document Revised: 09/21/2018 Document Reviewed: 11/15/2017 Elsevier Patient Education  2020 [employer].

## 2018-12-12 NOTE — Progress Notes (Signed)
Cardiology Office Note:    Date:  12/12/2018   ID:  Michael Moyer, DOB December 31, 1950, MRN [id number]  PCP:  Street, Sharon Mt, MD  Cardiologist:  Jenean Lindau, MD   Referring MD: [address] Sharon Mt, *    ASSESSMENT:    1. Chest tightness   2. Essential hypertension   3. Mixed dyslipidemia   4. Diabetes mellitus due to underlying condition with unspecified complications ([place])   5. History of colon cancer    PLAN:    In order of problems listed above:  1. Chest tightness: Patient has multiple risk factors for coronary artery disease.  Importance of compliance with diet and medication stressed.  In view of his symptoms which are of concern following recommendations were given to the patient.  Sublingual nitroglycerin prescription was sent, its protocol and 911 protocol explained and the patient vocalized understanding questions were answered to the patient's satisfaction.  Patient will undergo Lexiscan sestamibi to assess the symptoms. 2. Essential hypertension: Blood pressures under good control.  We will do echocardiogram to assess murmur heard on auscultation. 3. Mixed dyslipidemia: Lipids are followed by primary care physician. 4. Diabetes mellitus: Diet was discussed and this is also followed by primary care. 5. He knows to go to the nearest emergency room for any concerning symptoms. 6. He will be seen in follow-up appointment in a month or earlier if he has any concerns.  He had multiple questions which were answered to his satisfaction.   Medication Adjustments/Labs and Tests Ordered: Current medicines are reviewed at length with the patient today.  Concerns regarding medicines are outlined above.  No orders of the defined types were placed in this encounter.  No orders of the defined types were placed in this encounter.    History of Present Illness:    Michael Moyer is a 68 y.o. male who is being seen today for the evaluation of chest discomfort and tightness  at the request of Street, Sharon Mt, *.  Patient is a pleasant 68 year old male.  He has past medical history of essential hypertension dyslipidemia diabetes mellitus and history of colon cancer in the past.  Patient mentions to me that overall he is feeling tired more easily now.  He mentions to me that anytime he tries to do something he gets very tired.  He has chest tightness sometimes going to the left shoulder.  This is not consistently happening with exertion.  At the time of my evaluation, the patient is alert awake oriented and in no distress.  For this reason he was referred here.  Past Medical History:  Diagnosis Date  . Admission for fitting and adjustment of vascular catheter 08/17/2017  . Colonic mass 06/23/2016  . LLQ pain 04/06/2016  . Malignant neoplasm of transverse colon ([place]) 07/07/2016  . Omental mass 04/15/2018  . Postoperative examination 07/28/2016    Past Surgical History:  Procedure Laterality Date  . BACK SURGERY    . CHOLECYSTECTOMY    . HERNIA REPAIR      Current Medications: Current Meds  Medication Sig  . atorvastatin (LIPITOR) 40 MG tablet Take 1 tablet by mouth daily.  Marland Kitchen gabapentin (NEURONTIN) 300 MG capsule Take 1 capsule by mouth daily.  Marland Kitchen lisinopril (ZESTRIL) 10 MG tablet Take 1 tablet by mouth daily.  . metFORMIN (GLUCOPHAGE) 500 MG tablet Take 1 tablet by mouth 2 (two) times daily.     Allergies:   Ciprofloxacin, Metronidazole, Prednisone, and Promethazine   Social History   Socioeconomic History  .  Marital status: Married    Spouse name: Not on file  . Number of children: Not on file  . Years of education: Not on file  . Highest education level: Not on file  Occupational History  . Not on file  Social Needs  . Financial resource strain: Not on file  . Food insecurity    Worry: Not on file    Inability: Not on file  . Transportation needs    Medical: Not on file    Non-medical: Not on file  Tobacco Use  . Smoking status: Former  Research scientist (life sciences)  . Smokeless tobacco: Never Used  Substance and Sexual Activity  . Alcohol use: Not on file  . Drug use: Not on file  . Sexual activity: Not on file  Lifestyle  . Physical activity    Days per week: Not on file    Minutes per session: Not on file  . Stress: Not on file  Relationships  . Social Herbalist on phone: Not on file    Gets together: Not on file    Attends religious service: Not on file    Active member of club or organization: Not on file    Attends meetings of clubs or organizations: Not on file    Relationship status: Not on file  Other Topics Concern  . Not on file  Social History Narrative  . Not on file     Family History: The patient's family history is not on file.  ROS:   Please see the history of present illness.    All other systems reviewed and are negative.  EKGs/Labs/Other Studies Reviewed:    The following studies were reviewed today: EKG reveals sinus rhythm inferior wall infarction of undetermined age and LVH.   Recent Labs: No results found for requested labs within last 8760 hours.  Recent Lipid Panel No results found for: CHOL, TRIG, HDL, CHOLHDL, VLDL, LDLCALC, LDLDIRECT  Physical Exam:    VS:  BP (!) 142/76 (BP Location: Left Arm, Patient Position: Sitting, Cuff Size: Normal)   Pulse 94   Ht 5\' 8"  (1.727 m)   Wt 183 lb (83 kg)   SpO2 97%   BMI 27.83 kg/m     Wt Readings from Last 3 Encounters:  12/12/18 183 lb (83 kg)     GEN: Patient is in no acute distress HEENT: Normal NECK: No JVD; No carotid bruits LYMPHATICS: No lymphadenopathy CARDIAC: S1 S2 regular, 2/6 systolic murmur at the apex. RESPIRATORY:  Clear to auscultation without rales, wheezing or rhonchi  ABDOMEN: Soft, non-tender, non-distended MUSCULOSKELETAL:  No edema; No deformity  SKIN: Warm and dry NEUROLOGIC:  Alert and oriented x 3 PSYCHIATRIC:  Normal affect    Signed, Jenean Lindau, MD  12/12/2018 4:15 PM    [facility]

## 2018-12-13 DIAGNOSIS — M1712 Unilateral primary osteoarthritis, left knee: Secondary | ICD-10-CM | POA: Diagnosis not present

## 2018-12-20 DIAGNOSIS — M1711 Unilateral primary osteoarthritis, right knee: Secondary | ICD-10-CM | POA: Diagnosis not present

## 2019-01-05 DIAGNOSIS — C775 Secondary and unspecified malignant neoplasm of intrapelvic lymph nodes: Secondary | ICD-10-CM | POA: Diagnosis not present

## 2019-01-05 DIAGNOSIS — C184 Malignant neoplasm of transverse colon: Secondary | ICD-10-CM | POA: Diagnosis not present

## 2019-01-17 ENCOUNTER — Ambulatory Visit: Payer: Medicare Other | Admitting: Cardiology

## 2019-01-24 ENCOUNTER — Telehealth (HOSPITAL_COMMUNITY): Payer: Self-pay | Admitting: *Deleted

## 2019-01-24 NOTE — Telephone Encounter (Signed)
Patient given detailed instructions per Myocardial Perfusion Study Information Sheet for the test on 02/01/19. Patient notified to arrive 15 minutes early and that it is imperative to arrive on time for appointment to keep from having the test rescheduled.  If you need to cancel or reschedule your appointment, please call the office within 24 hours of your appointment. . Patient verbalized understanding. Kirstie Peri

## 2019-01-27 ENCOUNTER — Ambulatory Visit (INDEPENDENT_AMBULATORY_CARE_PROVIDER_SITE_OTHER): Payer: Medicare Other

## 2019-01-27 ENCOUNTER — Other Ambulatory Visit: Payer: Self-pay

## 2019-01-27 DIAGNOSIS — R011 Cardiac murmur, unspecified: Secondary | ICD-10-CM

## 2019-01-27 NOTE — Progress Notes (Signed)
Complete echocardiogram has been performed.  Jimmy Hezekiah Veltre RDCS, RVT 

## 2019-02-01 ENCOUNTER — Ambulatory Visit (INDEPENDENT_AMBULATORY_CARE_PROVIDER_SITE_OTHER): Payer: Medicare Other

## 2019-02-01 ENCOUNTER — Encounter: Payer: Self-pay | Admitting: *Deleted

## 2019-02-01 ENCOUNTER — Other Ambulatory Visit: Payer: Self-pay

## 2019-02-01 DIAGNOSIS — I10 Essential (primary) hypertension: Secondary | ICD-10-CM

## 2019-02-01 DIAGNOSIS — E011 Iodine-deficiency related multinodular (endemic) goiter: Secondary | ICD-10-CM | POA: Diagnosis not present

## 2019-02-01 DIAGNOSIS — R0789 Other chest pain: Secondary | ICD-10-CM | POA: Diagnosis not present

## 2019-02-01 DIAGNOSIS — E088 Diabetes mellitus due to underlying condition with unspecified complications: Secondary | ICD-10-CM | POA: Diagnosis not present

## 2019-02-01 DIAGNOSIS — E782 Mixed hyperlipidemia: Secondary | ICD-10-CM

## 2019-02-01 MED ORDER — TECHNETIUM TC 99M TETROFOSMIN IV KIT
10.9000 | PACK | Freq: Once | INTRAVENOUS | Status: AC | PRN
  Administered 2019-02-01: 10.9 via INTRAVENOUS

## 2019-02-01 MED ORDER — REGADENOSON 0.4 MG/5ML IV SOLN
0.4000 mg | Freq: Once | INTRAVENOUS | Status: AC
  Administered 2019-02-01: 0.4 mg via INTRAVENOUS

## 2019-02-01 MED ORDER — TECHNETIUM TC 99M TETROFOSMIN IV KIT
29.0000 | PACK | Freq: Once | INTRAVENOUS | Status: AC | PRN
  Administered 2019-02-01: 29 via INTRAVENOUS

## 2019-02-13 ENCOUNTER — Ambulatory Visit: Payer: Medicare Other | Admitting: Cardiology

## 2019-02-16 DIAGNOSIS — C189 Malignant neoplasm of colon, unspecified: Secondary | ICD-10-CM | POA: Diagnosis not present

## 2019-02-16 DIAGNOSIS — C184 Malignant neoplasm of transverse colon: Secondary | ICD-10-CM | POA: Diagnosis not present

## 2019-02-17 DIAGNOSIS — D500 Iron deficiency anemia secondary to blood loss (chronic): Secondary | ICD-10-CM | POA: Diagnosis not present

## 2019-02-17 DIAGNOSIS — C772 Secondary and unspecified malignant neoplasm of intra-abdominal lymph nodes: Secondary | ICD-10-CM | POA: Diagnosis not present

## 2019-02-17 DIAGNOSIS — D649 Anemia, unspecified: Secondary | ICD-10-CM | POA: Diagnosis not present

## 2019-02-17 DIAGNOSIS — T451X5D Adverse effect of antineoplastic and immunosuppressive drugs, subsequent encounter: Secondary | ICD-10-CM | POA: Diagnosis not present

## 2019-02-17 DIAGNOSIS — G620 Drug-induced polyneuropathy: Secondary | ICD-10-CM | POA: Diagnosis not present

## 2019-02-17 DIAGNOSIS — C184 Malignant neoplasm of transverse colon: Secondary | ICD-10-CM | POA: Diagnosis not present

## 2019-02-24 LAB — MYOCARDIAL PERFUSION IMAGING
LV dias vol: 75 mL (ref 62–150)
LV sys vol: 31 mL
Peak HR: 91 {beats}/min
Rest HR: 67 {beats}/min
SDS: 2
SRS: 0
SSS: 2
TID: 1.22

## 2019-03-01 DIAGNOSIS — Z23 Encounter for immunization: Secondary | ICD-10-CM | POA: Diagnosis not present

## 2019-03-23 DIAGNOSIS — M1711 Unilateral primary osteoarthritis, right knee: Secondary | ICD-10-CM | POA: Diagnosis not present

## 2019-03-23 DIAGNOSIS — M1712 Unilateral primary osteoarthritis, left knee: Secondary | ICD-10-CM | POA: Diagnosis not present

## 2019-03-30 DIAGNOSIS — M1711 Unilateral primary osteoarthritis, right knee: Secondary | ICD-10-CM | POA: Diagnosis not present

## 2019-04-04 DIAGNOSIS — M1712 Unilateral primary osteoarthritis, left knee: Secondary | ICD-10-CM | POA: Diagnosis not present

## 2019-04-10 DIAGNOSIS — H25813 Combined forms of age-related cataract, bilateral: Secondary | ICD-10-CM | POA: Diagnosis not present

## 2019-04-10 DIAGNOSIS — E113593 Type 2 diabetes mellitus with proliferative diabetic retinopathy without macular edema, bilateral: Secondary | ICD-10-CM | POA: Diagnosis not present

## 2019-04-11 DIAGNOSIS — L570 Actinic keratosis: Secondary | ICD-10-CM | POA: Diagnosis not present

## 2019-04-11 DIAGNOSIS — L300 Nummular dermatitis: Secondary | ICD-10-CM | POA: Diagnosis not present

## 2019-04-11 DIAGNOSIS — L299 Pruritus, unspecified: Secondary | ICD-10-CM | POA: Diagnosis not present

## 2019-04-11 DIAGNOSIS — D485 Neoplasm of uncertain behavior of skin: Secondary | ICD-10-CM | POA: Diagnosis not present

## 2019-04-21 DIAGNOSIS — Z6826 Body mass index (BMI) 26.0-26.9, adult: Secondary | ICD-10-CM | POA: Diagnosis not present

## 2019-04-21 DIAGNOSIS — R109 Unspecified abdominal pain: Secondary | ICD-10-CM | POA: Diagnosis not present

## 2019-05-19 DIAGNOSIS — R978 Other abnormal tumor markers: Secondary | ICD-10-CM | POA: Diagnosis not present

## 2019-05-19 DIAGNOSIS — E119 Type 2 diabetes mellitus without complications: Secondary | ICD-10-CM | POA: Diagnosis not present

## 2019-05-19 DIAGNOSIS — C184 Malignant neoplasm of transverse colon: Secondary | ICD-10-CM | POA: Diagnosis not present

## 2019-05-19 DIAGNOSIS — C772 Secondary and unspecified malignant neoplasm of intra-abdominal lymph nodes: Secondary | ICD-10-CM | POA: Diagnosis not present

## 2019-07-07 DIAGNOSIS — M1711 Unilateral primary osteoarthritis, right knee: Secondary | ICD-10-CM | POA: Diagnosis not present

## 2019-07-14 DIAGNOSIS — M1712 Unilateral primary osteoarthritis, left knee: Secondary | ICD-10-CM | POA: Diagnosis not present

## 2019-08-16 DIAGNOSIS — M1990 Unspecified osteoarthritis, unspecified site: Secondary | ICD-10-CM | POA: Diagnosis not present

## 2019-08-16 DIAGNOSIS — U071 COVID-19: Secondary | ICD-10-CM | POA: Diagnosis not present

## 2019-08-16 DIAGNOSIS — R531 Weakness: Secondary | ICD-10-CM | POA: Diagnosis not present

## 2019-08-16 DIAGNOSIS — I10 Essential (primary) hypertension: Secondary | ICD-10-CM | POA: Diagnosis not present

## 2019-08-16 DIAGNOSIS — R0902 Hypoxemia: Secondary | ICD-10-CM | POA: Diagnosis not present

## 2019-09-04 DIAGNOSIS — R5383 Other fatigue: Secondary | ICD-10-CM | POA: Diagnosis not present

## 2019-09-04 DIAGNOSIS — Z85038 Personal history of other malignant neoplasm of large intestine: Secondary | ICD-10-CM | POA: Diagnosis not present

## 2019-09-04 DIAGNOSIS — E1169 Type 2 diabetes mellitus with other specified complication: Secondary | ICD-10-CM | POA: Diagnosis not present

## 2019-09-04 DIAGNOSIS — E785 Hyperlipidemia, unspecified: Secondary | ICD-10-CM | POA: Diagnosis not present

## 2019-09-08 DIAGNOSIS — C184 Malignant neoplasm of transverse colon: Secondary | ICD-10-CM | POA: Diagnosis not present

## 2019-09-08 DIAGNOSIS — D649 Anemia, unspecified: Secondary | ICD-10-CM | POA: Diagnosis not present

## 2019-10-09 ENCOUNTER — Other Ambulatory Visit: Payer: Self-pay

## 2019-10-09 ENCOUNTER — Other Ambulatory Visit: Payer: Self-pay | Admitting: Podiatry

## 2019-10-09 ENCOUNTER — Ambulatory Visit (INDEPENDENT_AMBULATORY_CARE_PROVIDER_SITE_OTHER): Payer: Medicare Other | Admitting: Podiatry

## 2019-10-09 ENCOUNTER — Ambulatory Visit (INDEPENDENT_AMBULATORY_CARE_PROVIDER_SITE_OTHER): Payer: Medicare Other

## 2019-10-09 DIAGNOSIS — M79671 Pain in right foot: Secondary | ICD-10-CM

## 2019-10-09 DIAGNOSIS — M67472 Ganglion, left ankle and foot: Secondary | ICD-10-CM | POA: Diagnosis not present

## 2019-10-09 DIAGNOSIS — M19071 Primary osteoarthritis, right ankle and foot: Secondary | ICD-10-CM

## 2019-10-09 DIAGNOSIS — M19072 Primary osteoarthritis, left ankle and foot: Secondary | ICD-10-CM | POA: Diagnosis not present

## 2019-10-09 DIAGNOSIS — M19079 Primary osteoarthritis, unspecified ankle and foot: Secondary | ICD-10-CM

## 2019-10-09 NOTE — Progress Notes (Signed)
  Subjective:  Patient ID: Michael Moyer, male    DOB: 03-May-1951,  MRN: [id number]  No chief complaint on file.  69 y.o. male presents with the above complaint. History confirmed with patient. States he has pain on the top of his right foot, present for several mpnths. 6/10 sharp pain, worse in AM, worse with sitting to standing. States 1 year ago he dropped a piece of wood on his foot.  Objective:  Physical Exam: warm, good capillary refill, no trophic changes or ulcerative lesions, normal DP and PT pulses and normal sensory exam. Right Foot: POP dorsal midfoot, POP 1st interspace with thick fluctuant cyst like mass appreciable.  No images are attached to the encounter.  Radiographs: X-ray of the right foot: degenerative changes of the midtarsal joint Assessment:   1. Arthrosis of midfoot, left   2. Ganglion cyst of left foot   3. Pain in right foot      Plan:  Patient was evaluated and treated and all questions answered.  Midfoot arthritis, possible cyst right 1st interspace -XR reviewed with patient -Order [place] RLE to eval for possible cyst -F/u US In 2 weeks plan for midfoot injection around that time.  Return in about 2 weeks (around 10/23/2019) for Ultrasound review for possible cyst.

## 2019-10-11 ENCOUNTER — Telehealth: Payer: Self-pay | Admitting: *Deleted

## 2019-10-11 DIAGNOSIS — M67472 Ganglion, left ankle and foot: Secondary | ICD-10-CM

## 2019-10-11 DIAGNOSIS — M19072 Primary osteoarthritis, left ankle and foot: Secondary | ICD-10-CM

## 2019-10-11 DIAGNOSIS — M79671 Pain in right foot: Secondary | ICD-10-CM

## 2019-10-11 NOTE — Telephone Encounter (Signed)
Faxed orders to [facility]. 

## 2019-10-11 NOTE — Telephone Encounter (Signed)
Left message informing Nancy of [place] orders to [facility] due to [facility] no longer performing, and to call [facility] [phone] for directions to their facility and to schedule an appt.

## 2019-10-11 NOTE — Telephone Encounter (Signed)
-----   Message from Evelina Bucy, DPM sent at 10/09/2019 10:57 AM EDT ----- Can we order [place] RLE for possible ganglion cyst - At [facility]?

## 2019-10-11 NOTE — Telephone Encounter (Signed)
[facility] states they are unable to perform any soft tissue US.

## 2019-10-12 DIAGNOSIS — M1712 Unilateral primary osteoarthritis, left knee: Secondary | ICD-10-CM | POA: Diagnosis not present

## 2019-10-12 NOTE — Telephone Encounter (Signed)
Ok send wherever we can for ST [place]

## 2019-10-13 ENCOUNTER — Other Ambulatory Visit: Payer: Self-pay | Admitting: Podiatry

## 2019-10-13 DIAGNOSIS — M19079 Primary osteoarthritis, unspecified ankle and foot: Secondary | ICD-10-CM

## 2019-10-20 ENCOUNTER — Ambulatory Visit
Admission: RE | Admit: 2019-10-20 | Discharge: 2019-10-20 | Disposition: A | Discharge status: Home / Self Care | Payer: Medicare Other | Source: Ambulatory Visit | Attending: Podiatry | Admitting: Podiatry

## 2019-10-20 DIAGNOSIS — M79671 Pain in right foot: Secondary | ICD-10-CM | POA: Diagnosis not present

## 2019-10-20 DIAGNOSIS — M67472 Ganglion, left ankle and foot: Secondary | ICD-10-CM

## 2019-10-20 DIAGNOSIS — R2241 Localized swelling, mass and lump, right lower limb: Secondary | ICD-10-CM | POA: Diagnosis not present

## 2019-10-20 DIAGNOSIS — M19072 Primary osteoarthritis, left ankle and foot: Secondary | ICD-10-CM

## 2019-10-24 ENCOUNTER — Ambulatory Visit (INDEPENDENT_AMBULATORY_CARE_PROVIDER_SITE_OTHER): Payer: Medicare Other | Admitting: Podiatry

## 2019-10-24 ENCOUNTER — Other Ambulatory Visit: Payer: Self-pay

## 2019-10-24 DIAGNOSIS — G588 Other specified mononeuropathies: Secondary | ICD-10-CM

## 2019-10-26 DIAGNOSIS — M1711 Unilateral primary osteoarthritis, right knee: Secondary | ICD-10-CM | POA: Diagnosis not present

## 2019-11-14 DIAGNOSIS — E663 Overweight: Secondary | ICD-10-CM | POA: Diagnosis not present

## 2019-11-14 DIAGNOSIS — S81802A Unspecified open wound, left lower leg, initial encounter: Secondary | ICD-10-CM | POA: Diagnosis not present

## 2019-11-14 DIAGNOSIS — I10 Essential (primary) hypertension: Secondary | ICD-10-CM | POA: Diagnosis not present

## 2019-11-14 DIAGNOSIS — L03116 Cellulitis of left lower limb: Secondary | ICD-10-CM | POA: Diagnosis not present

## 2019-11-20 ENCOUNTER — Other Ambulatory Visit: Payer: Self-pay

## 2019-11-20 ENCOUNTER — Ambulatory Visit (INDEPENDENT_AMBULATORY_CARE_PROVIDER_SITE_OTHER): Payer: Medicare Other | Admitting: Podiatry

## 2019-11-20 DIAGNOSIS — G588 Other specified mononeuropathies: Secondary | ICD-10-CM | POA: Diagnosis not present

## 2019-11-20 NOTE — Progress Notes (Signed)
  Subjective:  Patient ID: Michael Moyer, male    DOB: March 02, 1951,  MRN: [id number]  Chief Complaint  Patient presents with  . Neuroma    F/U Rt neurma Pt states," shot done real good." -pt denis pain/numbness/tingling tx: none   69 y.o. male presents with the above complaint. Hx reviewed.  Objective:  Physical Exam: warm, good capillary refill, no trophic changes or ulcerative lesions, normal DP and PT pulses and normal sensory exam. Right Foot:  POP 1st interspace with thick ST envelope noted. No images are attached to the encounter.  ST [place]: 4/30 FINDINGS: Ultrasound was performed over the area of concern between the first and second toes. There is no evidence of a mass or ganglion cyst or other abnormality in that region. There are no effusions in the first or second MTP joints. The adjacent tendons appear normal.  IMPRESSION: Negative ultrasound of the area of concern of the distal right foot. Specifically, no evidence of a ganglion cyst or soft tissue mass in the webspace between the first and second metatarsal heads. Assessment:   1. Interdigital neuroma    Plan:  Patient was evaluated and treated and all questions answered.  Possible neuroma right 1st interspace -Repeat injection right 1st interspace  Procedure: Neuroma Injection Location: Right 1st interspace Skin Prep: Alcohol. Injectate: 0.5 cc 0.5% marcaine plain, 0.5 cc dexamethasone phosphate. Disposition: Patient tolerated procedure well. Injection site dressed with a band-aid.  No follow-ups on file.

## 2019-11-20 NOTE — Progress Notes (Signed)
  Subjective:  Patient ID: Michael Moyer, male    DOB: [birth year],  MRN: [id number]  Chief Complaint  Patient presents with  . Cyst    F/U Rt cyst and review [place] Pt. states," aobu the same, with a little bit of pain." -pt denis redness/swelling Tx; none   69 y.o. male presents with the above complaint. Hx reviewed.  Objective:  Physical Exam: warm, good capillary refill, no trophic changes or ulcerative lesions, normal DP and PT pulses and normal sensory exam. Right Foot: POP dorsal midfoot, POP 1st interspace with thick ST envelope noted. No images are attached to the encounter.  ST [place]: 4/30 FINDINGS: Ultrasound was performed over the area of concern between the first and second toes. There is no evidence of a mass or ganglion cyst or other abnormality in that region. There are no effusions in the first or second MTP joints. The adjacent tendons appear normal.  IMPRESSION: Negative ultrasound of the area of concern of the distal right foot. Specifically, no evidence of a ganglion cyst or soft tissue mass in the webspace between the first and second metatarsal heads. Assessment:   1. Interdigital neuroma      Plan:  Patient was evaluated and treated and all questions answered.  Possible neuroma right 1st interspace -[place] reviewed -Injection delivered right 1st interspace  No follow-ups on file.

## 2020-01-09 DIAGNOSIS — M1712 Unilateral primary osteoarthritis, left knee: Secondary | ICD-10-CM | POA: Diagnosis not present

## 2020-01-11 DIAGNOSIS — M1712 Unilateral primary osteoarthritis, left knee: Secondary | ICD-10-CM | POA: Diagnosis not present

## 2020-01-30 DIAGNOSIS — M1711 Unilateral primary osteoarthritis, right knee: Secondary | ICD-10-CM | POA: Diagnosis not present

## 2020-03-08 DIAGNOSIS — M4316 Spondylolisthesis, lumbar region: Secondary | ICD-10-CM | POA: Diagnosis not present

## 2020-03-08 DIAGNOSIS — M5136 Other intervertebral disc degeneration, lumbar region: Secondary | ICD-10-CM | POA: Diagnosis not present

## 2020-03-08 DIAGNOSIS — I2510 Atherosclerotic heart disease of native coronary artery without angina pectoris: Secondary | ICD-10-CM | POA: Diagnosis not present

## 2020-03-08 DIAGNOSIS — M47816 Spondylosis without myelopathy or radiculopathy, lumbar region: Secondary | ICD-10-CM | POA: Diagnosis not present

## 2020-03-08 DIAGNOSIS — C184 Malignant neoplasm of transverse colon: Secondary | ICD-10-CM | POA: Diagnosis not present

## 2020-03-08 DIAGNOSIS — I700 Atherosclerosis of aorta: Secondary | ICD-10-CM | POA: Diagnosis not present

## 2020-03-08 DIAGNOSIS — K5730 Diverticulosis of large intestine without perforation or abscess without bleeding: Secondary | ICD-10-CM | POA: Diagnosis not present

## 2020-03-11 DIAGNOSIS — C772 Secondary and unspecified malignant neoplasm of intra-abdominal lymph nodes: Secondary | ICD-10-CM | POA: Diagnosis not present

## 2020-03-11 DIAGNOSIS — I10 Essential (primary) hypertension: Secondary | ICD-10-CM | POA: Diagnosis not present

## 2020-03-11 DIAGNOSIS — C184 Malignant neoplasm of transverse colon: Secondary | ICD-10-CM | POA: Diagnosis not present

## 2020-03-11 DIAGNOSIS — G620 Drug-induced polyneuropathy: Secondary | ICD-10-CM | POA: Diagnosis not present

## 2020-03-19 DIAGNOSIS — C184 Malignant neoplasm of transverse colon: Secondary | ICD-10-CM | POA: Diagnosis not present

## 2020-03-19 DIAGNOSIS — I10 Essential (primary) hypertension: Secondary | ICD-10-CM | POA: Diagnosis not present

## 2020-03-19 DIAGNOSIS — C772 Secondary and unspecified malignant neoplasm of intra-abdominal lymph nodes: Secondary | ICD-10-CM | POA: Diagnosis not present

## 2020-03-19 DIAGNOSIS — G620 Drug-induced polyneuropathy: Secondary | ICD-10-CM | POA: Diagnosis not present

## 2020-03-27 DIAGNOSIS — C184 Malignant neoplasm of transverse colon: Secondary | ICD-10-CM | POA: Diagnosis not present

## 2020-03-27 DIAGNOSIS — Z23 Encounter for immunization: Secondary | ICD-10-CM | POA: Diagnosis not present

## 2020-03-27 DIAGNOSIS — G620 Drug-induced polyneuropathy: Secondary | ICD-10-CM | POA: Diagnosis not present

## 2020-03-27 DIAGNOSIS — I10 Essential (primary) hypertension: Secondary | ICD-10-CM | POA: Diagnosis not present

## 2020-03-27 DIAGNOSIS — C772 Secondary and unspecified malignant neoplasm of intra-abdominal lymph nodes: Secondary | ICD-10-CM | POA: Diagnosis not present

## 2020-04-05 DIAGNOSIS — Z6827 Body mass index (BMI) 27.0-27.9, adult: Secondary | ICD-10-CM | POA: Diagnosis not present

## 2020-04-05 DIAGNOSIS — H1031 Unspecified acute conjunctivitis, right eye: Secondary | ICD-10-CM | POA: Diagnosis not present

## 2020-04-09 DIAGNOSIS — B0059 Other herpesviral disease of eye: Secondary | ICD-10-CM | POA: Diagnosis not present

## 2020-04-12 DIAGNOSIS — M1712 Unilateral primary osteoarthritis, left knee: Secondary | ICD-10-CM | POA: Diagnosis not present

## 2020-04-26 DIAGNOSIS — M1711 Unilateral primary osteoarthritis, right knee: Secondary | ICD-10-CM | POA: Diagnosis not present

## 2020-05-07 DIAGNOSIS — E1169 Type 2 diabetes mellitus with other specified complication: Secondary | ICD-10-CM | POA: Diagnosis not present

## 2020-05-07 DIAGNOSIS — E663 Overweight: Secondary | ICD-10-CM | POA: Diagnosis not present

## 2020-05-07 DIAGNOSIS — I10 Essential (primary) hypertension: Secondary | ICD-10-CM | POA: Diagnosis not present

## 2020-05-07 DIAGNOSIS — E785 Hyperlipidemia, unspecified: Secondary | ICD-10-CM | POA: Diagnosis not present

## 2020-06-21 DIAGNOSIS — M25511 Pain in right shoulder: Secondary | ICD-10-CM | POA: Diagnosis not present

## 2020-06-21 DIAGNOSIS — M5412 Radiculopathy, cervical region: Secondary | ICD-10-CM | POA: Diagnosis not present

## 2020-06-24 DIAGNOSIS — Z1159 Encounter for screening for other viral diseases: Secondary | ICD-10-CM | POA: Insufficient documentation

## 2020-06-24 DIAGNOSIS — Z85038 Personal history of other malignant neoplasm of large intestine: Secondary | ICD-10-CM | POA: Diagnosis not present

## 2020-06-24 DIAGNOSIS — Z1211 Encounter for screening for malignant neoplasm of colon: Secondary | ICD-10-CM | POA: Diagnosis not present

## 2020-06-24 HISTORY — DX: Personal history of other malignant neoplasm of large intestine: Z85.038

## 2020-06-25 DIAGNOSIS — M1711 Unilateral primary osteoarthritis, right knee: Secondary | ICD-10-CM | POA: Diagnosis not present

## 2020-07-10 ENCOUNTER — Telehealth: Payer: Self-pay | Admitting: Oncology

## 2020-07-10 DIAGNOSIS — M79609 Pain in unspecified limb: Secondary | ICD-10-CM | POA: Diagnosis not present

## 2020-07-10 DIAGNOSIS — E559 Vitamin D deficiency, unspecified: Secondary | ICD-10-CM | POA: Diagnosis not present

## 2020-07-10 DIAGNOSIS — R52 Pain, unspecified: Secondary | ICD-10-CM | POA: Diagnosis not present

## 2020-07-10 DIAGNOSIS — Z01818 Encounter for other preprocedural examination: Secondary | ICD-10-CM | POA: Diagnosis not present

## 2020-07-10 DIAGNOSIS — Z79899 Other long term (current) drug therapy: Secondary | ICD-10-CM | POA: Diagnosis not present

## 2020-07-10 NOTE — Telephone Encounter (Signed)
Patient's spouse requested patient to be seen before 2/24 due to upcoming surgery.  Rescheduled to 2/7 Labs 3:00 pm - Follow 3:30 pm

## 2020-07-12 DIAGNOSIS — Z1152 Encounter for screening for COVID-19: Secondary | ICD-10-CM | POA: Diagnosis not present

## 2020-07-16 DIAGNOSIS — M1712 Unilateral primary osteoarthritis, left knee: Secondary | ICD-10-CM | POA: Diagnosis not present

## 2020-07-18 DIAGNOSIS — Z08 Encounter for follow-up examination after completed treatment for malignant neoplasm: Secondary | ICD-10-CM | POA: Diagnosis not present

## 2020-07-18 DIAGNOSIS — E119 Type 2 diabetes mellitus without complications: Secondary | ICD-10-CM | POA: Diagnosis not present

## 2020-07-18 DIAGNOSIS — Z85038 Personal history of other malignant neoplasm of large intestine: Secondary | ICD-10-CM | POA: Diagnosis not present

## 2020-07-18 DIAGNOSIS — K5730 Diverticulosis of large intestine without perforation or abscess without bleeding: Secondary | ICD-10-CM | POA: Diagnosis not present

## 2020-07-19 ENCOUNTER — Other Ambulatory Visit: Payer: Self-pay | Admitting: Hematology and Oncology

## 2020-07-19 DIAGNOSIS — C189 Malignant neoplasm of colon, unspecified: Secondary | ICD-10-CM

## 2020-07-19 NOTE — Progress Notes (Signed)
[facility]  [address] [place],    [address] [phone]  Clinic Day:  07/22/2020  Referring physician: Emmaline Kluver, *     CHIEF COMPLAINT:  CC: A 70 year old male with a history of stage IIIC right colon cancer diagnosed in January 2018 with recurrence in November 2019 here for four month evaluation  Current Treatment: Surveillance   HISTORY OF PRESENT ILLNESS:  Michael Moyer is a 70 y.o. male with a history of stage IIIC (T4a N2a M0) right colon cancer diagnosed in January 2018.  He was treated with a right hemicolectomy.  Pathology revealed a 6 cm, grade 2, adenocarcinoma with invasion of the visceral peritoneum adherent to the omentum.  Four of 31 nodes were positive for metastasis.  MMR was normal and MSI stable.  Due to his personal and family history of malignancy, he was offered testing for hereditary cancer syndrome, but he declined.  He was also found to have iron deficiency and was placed on hemocyte daily, which was later discontinued.  He received adjuvant chemotherapy with FOLFOX for 12 cycles completed in August 2018.  Colonoscopy in January 2019 did not reveal any abnormality.  Three year follow-up was recommended.  In April 2019, there was an increase in the CEA to 7.8.  CT chest, abdomen and pelvis in May did not reveal evidence of recurrence.  The CEA was then down to 5.4 in July.  He was seen in October for routine follow up and had an another increase in his CEA to 8.4.  CT chest, abdomen and pelvis revealed a 3.7 cm soft tissue density along the anterior margin of the stomach below and to the left of the left hepatic lobe.  In retrospect, this was felt to be present previously, but much smaller.  There were foci of fat necrosis along the upper omentum, separate from that area.  PET scan revealed intense hypermetabolic activity with an SUV of 7.7 in the the lobular lesion within the lesser omentum.  There were no  other findings suspicious for recurrent/metastatic disease.  He saw Dr. Noberto Retort and underwent surgical resection of this mass in November.  Pathology revealed metastatic colorectal adenocarcinoma within a lymph node measuring 4 cm in greatest dimension, with a foci of extranodal extension.  He was seen for follow-up in December and as the recurrence was resected and there was no evidence of disease elsewhere, palliative chemotherapy was not recommended.  We have been following his CEA, which has remained normal. Repeat CT imaging in September did not reveal any evidence of malignancy.  The prior clustered omental nodules in the upper omentum or no longer visible.  There was a 4 mm hypodense lesion in segment 2 of the liver, not readily seen on imaging in October 2019, but probably subtly visible on imaging in May 2019 and felt to be most likely likely benign, but may merit surveillance.  There was a healing new fracture of the right anterior second rib.  Aortic Atherosclerosis and coronary atherosclerosis were seen.  There was descending and sigmoid colon diverticulosis.  There were old small areas of fat necrosis in the left upper quadrant omentum and right upper quadrant mesentery.  There was chronic pars defects at L5 with impingement at L3-4, L4-5, and especially L5-S1.    CT chest, abdomen and pelvis from September 24th revealed no findings of active or recurrent malignancy.  Colonoscopy 07-18-2020 did not reveal any abnormalities.   INTERVAL HISTORY:  Michael Moyer  is here for routine follow up of his recurrent colon cancer. He has been well since his last visit. He had colonoscopy last week with no abnormal findings. He has chronic diarrhea which he now attributes to his metformin. This is improving since the dose was adjusted to 500 mg daily. He denies fever, chills, nausea or vomiting. He has been cutting carbs from his diet to improve his diabetes and presents with an 11 pound weight loss today. His most  recent A1C was 6.7. He denies shortness of breath, chest pain or cough. He is preparing for knee replacement next week and will follow with the second knee once he has recovered. CBC and CMP are unremarkable today.   REVIEW OF SYSTEMS:  Review of Systems  Constitutional: Negative for appetite change, chills, diaphoresis, fatigue, fever and unexpected weight change.  HENT:   Negative for hearing loss, lump/mass, mouth sores, nosebleeds, sore throat, tinnitus, trouble swallowing and voice change.   Eyes: Negative for eye problems and icterus.  Respiratory: Negative for chest tightness, cough, hemoptysis, shortness of breath and wheezing.   Cardiovascular: Negative for chest pain, leg swelling and palpitations.  Gastrointestinal: Positive for diarrhea. Negative for abdominal distention, abdominal pain, blood in stool, constipation, nausea, rectal pain and vomiting.  Endocrine: Negative for hot flashes.  Genitourinary: Negative for bladder incontinence, difficulty urinating, dyspareunia, dysuria, frequency, hematuria and nocturia.   Musculoskeletal: Negative for arthralgias, back pain, flank pain, gait problem, myalgias, neck pain and neck stiffness.  Skin: Negative for itching, rash and wound.  Neurological: Negative for dizziness, extremity weakness, gait problem, headaches, light-headedness, numbness, seizures and speech difficulty.  Hematological: Negative for adenopathy. Does not bruise/bleed easily.  Psychiatric/Behavioral: Negative for confusion, decreased concentration, depression, sleep disturbance and suicidal ideas. The patient is not nervous/anxious.      VITALS:  Blood pressure (!) 158/72, pulse 72, temperature 98.4 F (36.9 C), temperature source Oral, resp. rate 18, height 5' 6.5" (1.689 m), weight 175 lb (79.4 kg), SpO2 98 %.  Wt Readings from Last 3 Encounters:  07/22/20 175 lb (79.4 kg)  02/01/19 183 lb (83 kg)  12/12/18 183 lb (83 kg)    Body mass index is 27.82  kg/m.  Performance status (ECOG): 0 - Asymptomatic  PHYSICAL EXAM:  Physical Exam Constitutional:      General: He is not in acute distress.    Appearance: Normal appearance. He is normal weight. He is not ill-appearing, toxic-appearing or diaphoretic.  HENT:     Head: Normocephalic and atraumatic.     Right Ear: Tympanic membrane normal.     Left Ear: Tympanic membrane normal.     Nose: Nose normal. No congestion or rhinorrhea.     Mouth/Throat:     Mouth: Mucous membranes are moist.     Pharynx: Oropharynx is clear. No oropharyngeal exudate or posterior oropharyngeal erythema.  Eyes:     General: No scleral icterus.       Right eye: No discharge.        Left eye: No discharge.     Extraocular Movements: Extraocular movements intact.     Conjunctiva/sclera: Conjunctivae normal.     Pupils: Pupils are equal, round, and reactive to light.  Neck:     Vascular: No carotid bruit.  Cardiovascular:     Rate and Rhythm: Normal rate and regular rhythm.     Heart sounds: No murmur heard. No friction rub. No gallop.   Pulmonary:     Effort: Pulmonary effort is normal. No respiratory  distress.     Breath sounds: Normal breath sounds. No stridor. No wheezing, rhonchi or rales.  Chest:     Chest wall: No tenderness.  Abdominal:     General: Abdomen is flat. Bowel sounds are normal. There is no distension.     Palpations: There is no mass.     Tenderness: There is no abdominal tenderness. There is no right CVA tenderness, left CVA tenderness, guarding or rebound.     Hernia: No hernia is present.  Musculoskeletal:        General: No swelling, tenderness, deformity or signs of injury. Normal range of motion.     Cervical back: Normal range of motion and neck supple. No rigidity or tenderness.     Right lower leg: No edema.     Left lower leg: No edema.  Lymphadenopathy:     Cervical: No cervical adenopathy.  Skin:    General: Skin is warm and dry.     Capillary Refill: Capillary  refill takes less than 2 seconds.     Coloration: Skin is not jaundiced or pale.     Findings: No bruising, erythema, lesion or rash.  Neurological:     General: No focal deficit present.     Mental Status: He is alert and oriented to person, place, and time. Mental status is at baseline.     Cranial Nerves: No cranial nerve deficit.     Sensory: No sensory deficit.     Motor: No weakness.     Coordination: Coordination normal.     Gait: Gait normal.     Deep Tendon Reflexes: Reflexes normal.  Psychiatric:        Mood and Affect: Mood normal.        Behavior: Behavior normal.        Thought Content: Thought content normal.        Judgment: Judgment normal.     LABS:   CBC Latest Ref Rng & Units 07/22/2020  WBC - 10.7  Hemoglobin 13.5 - 17.5 15.3  Hematocrit 41 - 53 47  Platelets 150 - 399 168   CMP Latest Ref Rng & Units 07/22/2020  BUN 4 - 21 13  Creatinine 0.6 - 1.3 0.9  Sodium 137 - 147 137  Potassium 3.4 - 5.3 4.2  Chloride 99 - 108 101  CO2 13 - 22 27(A)  Calcium 8.7 - 10.7 8.7  Alkaline Phos 25 - 125 49  AST 14 - 40 30  ALT 10 - 40 31     No results found for: CEA1 / No results found for: CEA1 No results found for: PSA1 No results found for: VFI433 No results found for: IRJ188  No results found for: TOTALPROTELP, ALBUMINELP, A1GS, A2GS, BETS, BETA2SER, GAMS, MSPIKE, SPEI No results found for: TIBC, FERRITIN, IRONPCTSAT No results found for: LDH   STUDIES:  No results found.   Allergies:  Allergies  Allergen Reactions  . Ciprofloxacin Other (See Comments)    Unknown  . Metronidazole Other (See Comments)    Unknown  . Prednisone Other (See Comments)    Unknown  . Promethazine Other (See Comments)    hysterics    Current Medications: Current Outpatient Medications  Medication Sig Dispense Refill  . atorvastatin (LIPITOR) 40 MG tablet Take 1 tablet by mouth daily.    Marland Kitchen gabapentin (NEURONTIN) 300 MG capsule Take 1 capsule by mouth daily.    Marland Kitchen  lisinopril (ZESTRIL) 10 MG tablet Take 1 tablet by mouth daily.    Marland Kitchen  metFORMIN (GLUCOPHAGE) 500 MG tablet Take 1 tablet by mouth 2 (two) times daily.    . nitroGLYCERIN (NITROSTAT) 0.4 MG SL tablet Place 1 tablet (0.4 mg total) under the tongue every 5 (five) minutes as needed. 25 tablet 3   No current facility-administered medications for this visit.     ASSESSMENT & PLAN:   Assessment:   1. History of stage IIIC colon cancer diagnosed in January 2018, treated with surgery and adjuvant FOLFOX chemotherapy. Repeat colonoscopy last week is normal. It is recommended he repeat this in 5 years (2027).  2. Recurrent colon cancer in an abdominal lymph node in November 2019, which was surgically resected.  As this was the only site of disease, palliative chemotherapy was not recommended.  He has been on observation for 2 1/2  years.  CEA has remained normal and CT imaging in September 2021 did not reveal any evidence of malignancy.    Plan: We will plan to see him back in 4 months with a CBC, comprehensive metabolic panel and CEA for repeat examination unless he has not recovered from knee surgery at that time.  We will plan to repeat CT imaging in September 2022.  The patient and his wife understand the plans discussed today and are in agreement with them.  They know to contact our office if they develop concerns prior to his next appointment.     Melodye Ped, NP [facility] AT [facility] [address] [place] [address] Dept: [phone] Dept Fax: [phone]

## 2020-07-22 ENCOUNTER — Telehealth: Payer: Self-pay | Admitting: Oncology

## 2020-07-22 ENCOUNTER — Encounter: Payer: Self-pay | Admitting: Oncology

## 2020-07-22 ENCOUNTER — Other Ambulatory Visit: Payer: Self-pay

## 2020-07-22 ENCOUNTER — Inpatient Hospital Stay (INDEPENDENT_AMBULATORY_CARE_PROVIDER_SITE_OTHER): Payer: Medicare Other | Admitting: Hematology and Oncology

## 2020-07-22 ENCOUNTER — Inpatient Hospital Stay: Payer: Medicare Other | Attending: Oncology

## 2020-07-22 VITALS — BP 158/72 | HR 72 | Temp 98.4°F | Resp 18 | Ht 66.5 in | Wt 175.0 lb

## 2020-07-22 DIAGNOSIS — D649 Anemia, unspecified: Secondary | ICD-10-CM | POA: Diagnosis not present

## 2020-07-22 DIAGNOSIS — C189 Malignant neoplasm of colon, unspecified: Secondary | ICD-10-CM | POA: Diagnosis not present

## 2020-07-22 DIAGNOSIS — C182 Malignant neoplasm of ascending colon: Secondary | ICD-10-CM | POA: Diagnosis present

## 2020-07-22 DIAGNOSIS — C184 Malignant neoplasm of transverse colon: Secondary | ICD-10-CM | POA: Diagnosis not present

## 2020-07-22 LAB — HEPATIC FUNCTION PANEL
ALT: 31 (ref 10–40)
AST: 30 (ref 14–40)
Alkaline Phosphatase: 49 (ref 25–125)
Bilirubin, Total: 0.8

## 2020-07-22 LAB — COMPREHENSIVE METABOLIC PANEL
Albumin: 4.1 (ref 3.5–5.0)
Calcium: 8.7 (ref 8.7–10.7)

## 2020-07-22 LAB — CBC AND DIFFERENTIAL
HCT: 47 (ref 41–53)
Hemoglobin: 15.3 (ref 13.5–17.5)
Neutrophils Absolute: 7.06
Platelets: 168 (ref 150–399)
WBC: 10.7

## 2020-07-22 LAB — BASIC METABOLIC PANEL
BUN: 13 (ref 4–21)
CO2: 27 — AB (ref 13–22)
Chloride: 101 (ref 99–108)
Creatinine: 0.9 (ref 0.6–1.3)
Glucose: 142
Potassium: 4.2 (ref 3.4–5.3)
Sodium: 137 (ref 137–147)

## 2020-07-22 LAB — CBC: RBC: 5.25 — AB (ref 3.87–5.11)

## 2020-07-22 NOTE — Telephone Encounter (Signed)
Per 2/7 LOS, patient scheduled for June Appt's.  Gave patient Appt Summary

## 2020-07-23 DIAGNOSIS — M1711 Unilateral primary osteoarthritis, right knee: Secondary | ICD-10-CM | POA: Diagnosis not present

## 2020-07-23 DIAGNOSIS — Z1159 Encounter for screening for other viral diseases: Secondary | ICD-10-CM | POA: Diagnosis not present

## 2020-07-23 DIAGNOSIS — Z1152 Encounter for screening for COVID-19: Secondary | ICD-10-CM | POA: Diagnosis not present

## 2020-07-23 LAB — CEA: CEA: 1.4 ng/mL (ref 0.0–4.7)

## 2020-07-29 DIAGNOSIS — M1711 Unilateral primary osteoarthritis, right knee: Secondary | ICD-10-CM | POA: Diagnosis not present

## 2020-07-29 DIAGNOSIS — Z87891 Personal history of nicotine dependence: Secondary | ICD-10-CM | POA: Diagnosis not present

## 2020-07-29 DIAGNOSIS — Z7952 Long term (current) use of systemic steroids: Secondary | ICD-10-CM | POA: Diagnosis not present

## 2020-07-29 DIAGNOSIS — Z471 Aftercare following joint replacement surgery: Secondary | ICD-10-CM | POA: Diagnosis not present

## 2020-07-29 DIAGNOSIS — Z7984 Long term (current) use of oral hypoglycemic drugs: Secondary | ICD-10-CM | POA: Diagnosis not present

## 2020-07-29 DIAGNOSIS — E119 Type 2 diabetes mellitus without complications: Secondary | ICD-10-CM | POA: Diagnosis not present

## 2020-07-29 DIAGNOSIS — G8918 Other acute postprocedural pain: Secondary | ICD-10-CM | POA: Diagnosis not present

## 2020-07-29 DIAGNOSIS — K219 Gastro-esophageal reflux disease without esophagitis: Secondary | ICD-10-CM | POA: Diagnosis not present

## 2020-07-29 DIAGNOSIS — I10 Essential (primary) hypertension: Secondary | ICD-10-CM | POA: Diagnosis not present

## 2020-07-29 DIAGNOSIS — E785 Hyperlipidemia, unspecified: Secondary | ICD-10-CM | POA: Diagnosis not present

## 2020-07-29 DIAGNOSIS — Z96651 Presence of right artificial knee joint: Secondary | ICD-10-CM | POA: Diagnosis not present

## 2020-07-30 DIAGNOSIS — I10 Essential (primary) hypertension: Secondary | ICD-10-CM | POA: Diagnosis not present

## 2020-07-30 DIAGNOSIS — M1711 Unilateral primary osteoarthritis, right knee: Secondary | ICD-10-CM | POA: Diagnosis not present

## 2020-07-30 DIAGNOSIS — E119 Type 2 diabetes mellitus without complications: Secondary | ICD-10-CM | POA: Diagnosis not present

## 2020-07-30 DIAGNOSIS — Z87891 Personal history of nicotine dependence: Secondary | ICD-10-CM | POA: Diagnosis not present

## 2020-07-30 DIAGNOSIS — E785 Hyperlipidemia, unspecified: Secondary | ICD-10-CM | POA: Diagnosis not present

## 2020-07-30 DIAGNOSIS — K219 Gastro-esophageal reflux disease without esophagitis: Secondary | ICD-10-CM | POA: Diagnosis not present

## 2020-07-31 DIAGNOSIS — E119 Type 2 diabetes mellitus without complications: Secondary | ICD-10-CM | POA: Diagnosis not present

## 2020-07-31 DIAGNOSIS — Z791 Long term (current) use of non-steroidal anti-inflammatories (NSAID): Secondary | ICD-10-CM | POA: Diagnosis not present

## 2020-07-31 DIAGNOSIS — I10 Essential (primary) hypertension: Secondary | ICD-10-CM | POA: Diagnosis not present

## 2020-07-31 DIAGNOSIS — Z7984 Long term (current) use of oral hypoglycemic drugs: Secondary | ICD-10-CM | POA: Diagnosis not present

## 2020-07-31 DIAGNOSIS — E785 Hyperlipidemia, unspecified: Secondary | ICD-10-CM | POA: Diagnosis not present

## 2020-07-31 DIAGNOSIS — Z471 Aftercare following joint replacement surgery: Secondary | ICD-10-CM | POA: Diagnosis not present

## 2020-07-31 DIAGNOSIS — K219 Gastro-esophageal reflux disease without esophagitis: Secondary | ICD-10-CM | POA: Diagnosis not present

## 2020-07-31 DIAGNOSIS — Z87891 Personal history of nicotine dependence: Secondary | ICD-10-CM | POA: Diagnosis not present

## 2020-07-31 DIAGNOSIS — Z96651 Presence of right artificial knee joint: Secondary | ICD-10-CM | POA: Diagnosis not present

## 2020-07-31 DIAGNOSIS — Z7952 Long term (current) use of systemic steroids: Secondary | ICD-10-CM | POA: Diagnosis not present

## 2020-07-31 DIAGNOSIS — Z79891 Long term (current) use of opiate analgesic: Secondary | ICD-10-CM | POA: Diagnosis not present

## 2020-07-31 DIAGNOSIS — Z7982 Long term (current) use of aspirin: Secondary | ICD-10-CM | POA: Diagnosis not present

## 2020-08-01 DIAGNOSIS — Z471 Aftercare following joint replacement surgery: Secondary | ICD-10-CM | POA: Diagnosis not present

## 2020-08-01 DIAGNOSIS — E785 Hyperlipidemia, unspecified: Secondary | ICD-10-CM | POA: Diagnosis not present

## 2020-08-01 DIAGNOSIS — I10 Essential (primary) hypertension: Secondary | ICD-10-CM | POA: Diagnosis not present

## 2020-08-01 DIAGNOSIS — K219 Gastro-esophageal reflux disease without esophagitis: Secondary | ICD-10-CM | POA: Diagnosis not present

## 2020-08-01 DIAGNOSIS — E119 Type 2 diabetes mellitus without complications: Secondary | ICD-10-CM | POA: Diagnosis not present

## 2020-08-01 DIAGNOSIS — Z96651 Presence of right artificial knee joint: Secondary | ICD-10-CM | POA: Diagnosis not present

## 2020-08-02 ENCOUNTER — Other Ambulatory Visit: Payer: Self-pay

## 2020-08-02 DIAGNOSIS — T451X5A Adverse effect of antineoplastic and immunosuppressive drugs, initial encounter: Secondary | ICD-10-CM

## 2020-08-02 DIAGNOSIS — E119 Type 2 diabetes mellitus without complications: Secondary | ICD-10-CM | POA: Diagnosis not present

## 2020-08-02 DIAGNOSIS — Z96651 Presence of right artificial knee joint: Secondary | ICD-10-CM | POA: Diagnosis not present

## 2020-08-02 DIAGNOSIS — K219 Gastro-esophageal reflux disease without esophagitis: Secondary | ICD-10-CM | POA: Diagnosis not present

## 2020-08-02 DIAGNOSIS — I10 Essential (primary) hypertension: Secondary | ICD-10-CM | POA: Diagnosis not present

## 2020-08-02 DIAGNOSIS — Z471 Aftercare following joint replacement surgery: Secondary | ICD-10-CM | POA: Diagnosis not present

## 2020-08-02 DIAGNOSIS — E785 Hyperlipidemia, unspecified: Secondary | ICD-10-CM | POA: Diagnosis not present

## 2020-08-02 DIAGNOSIS — G620 Drug-induced polyneuropathy: Secondary | ICD-10-CM

## 2020-08-02 MED ORDER — GABAPENTIN 300 MG PO CAPS: 300.0000 mg | ORAL_CAPSULE | Freq: Two times a day (BID) | ORAL | 2 refills | Status: DC

## 2020-08-03 DIAGNOSIS — E119 Type 2 diabetes mellitus without complications: Secondary | ICD-10-CM | POA: Diagnosis not present

## 2020-08-03 DIAGNOSIS — Z96651 Presence of right artificial knee joint: Secondary | ICD-10-CM | POA: Diagnosis not present

## 2020-08-03 DIAGNOSIS — Z471 Aftercare following joint replacement surgery: Secondary | ICD-10-CM | POA: Diagnosis not present

## 2020-08-03 DIAGNOSIS — I10 Essential (primary) hypertension: Secondary | ICD-10-CM | POA: Diagnosis not present

## 2020-08-03 DIAGNOSIS — E785 Hyperlipidemia, unspecified: Secondary | ICD-10-CM | POA: Diagnosis not present

## 2020-08-03 DIAGNOSIS — K219 Gastro-esophageal reflux disease without esophagitis: Secondary | ICD-10-CM | POA: Diagnosis not present

## 2020-08-05 DIAGNOSIS — E785 Hyperlipidemia, unspecified: Secondary | ICD-10-CM | POA: Diagnosis not present

## 2020-08-05 DIAGNOSIS — Z96651 Presence of right artificial knee joint: Secondary | ICD-10-CM | POA: Diagnosis not present

## 2020-08-05 DIAGNOSIS — K219 Gastro-esophageal reflux disease without esophagitis: Secondary | ICD-10-CM | POA: Diagnosis not present

## 2020-08-05 DIAGNOSIS — I10 Essential (primary) hypertension: Secondary | ICD-10-CM | POA: Diagnosis not present

## 2020-08-05 DIAGNOSIS — Z471 Aftercare following joint replacement surgery: Secondary | ICD-10-CM | POA: Diagnosis not present

## 2020-08-05 DIAGNOSIS — E119 Type 2 diabetes mellitus without complications: Secondary | ICD-10-CM | POA: Diagnosis not present

## 2020-08-06 DIAGNOSIS — E785 Hyperlipidemia, unspecified: Secondary | ICD-10-CM | POA: Diagnosis not present

## 2020-08-06 DIAGNOSIS — I10 Essential (primary) hypertension: Secondary | ICD-10-CM | POA: Diagnosis not present

## 2020-08-06 DIAGNOSIS — E119 Type 2 diabetes mellitus without complications: Secondary | ICD-10-CM | POA: Diagnosis not present

## 2020-08-06 DIAGNOSIS — K219 Gastro-esophageal reflux disease without esophagitis: Secondary | ICD-10-CM | POA: Diagnosis not present

## 2020-08-06 DIAGNOSIS — Z471 Aftercare following joint replacement surgery: Secondary | ICD-10-CM | POA: Diagnosis not present

## 2020-08-06 DIAGNOSIS — Z96651 Presence of right artificial knee joint: Secondary | ICD-10-CM | POA: Diagnosis not present

## 2020-08-08 ENCOUNTER — Other Ambulatory Visit: Payer: Medicare Other

## 2020-08-08 ENCOUNTER — Ambulatory Visit: Payer: Medicare Other | Admitting: Oncology

## 2020-08-08 ENCOUNTER — Ambulatory Visit: Payer: Medicare Other | Admitting: Hematology and Oncology

## 2020-08-08 DIAGNOSIS — Z96651 Presence of right artificial knee joint: Secondary | ICD-10-CM | POA: Diagnosis not present

## 2020-08-08 DIAGNOSIS — E785 Hyperlipidemia, unspecified: Secondary | ICD-10-CM | POA: Diagnosis not present

## 2020-08-08 DIAGNOSIS — K219 Gastro-esophageal reflux disease without esophagitis: Secondary | ICD-10-CM | POA: Diagnosis not present

## 2020-08-08 DIAGNOSIS — E119 Type 2 diabetes mellitus without complications: Secondary | ICD-10-CM | POA: Diagnosis not present

## 2020-08-08 DIAGNOSIS — Z471 Aftercare following joint replacement surgery: Secondary | ICD-10-CM | POA: Diagnosis not present

## 2020-08-08 DIAGNOSIS — I10 Essential (primary) hypertension: Secondary | ICD-10-CM | POA: Diagnosis not present

## 2020-08-09 DIAGNOSIS — Z96651 Presence of right artificial knee joint: Secondary | ICD-10-CM | POA: Diagnosis not present

## 2020-08-09 DIAGNOSIS — K219 Gastro-esophageal reflux disease without esophagitis: Secondary | ICD-10-CM | POA: Diagnosis not present

## 2020-08-09 DIAGNOSIS — Z471 Aftercare following joint replacement surgery: Secondary | ICD-10-CM | POA: Diagnosis not present

## 2020-08-09 DIAGNOSIS — E119 Type 2 diabetes mellitus without complications: Secondary | ICD-10-CM | POA: Diagnosis not present

## 2020-08-09 DIAGNOSIS — I10 Essential (primary) hypertension: Secondary | ICD-10-CM | POA: Diagnosis not present

## 2020-08-09 DIAGNOSIS — E785 Hyperlipidemia, unspecified: Secondary | ICD-10-CM | POA: Diagnosis not present

## 2020-08-14 DIAGNOSIS — M6281 Muscle weakness (generalized): Secondary | ICD-10-CM | POA: Diagnosis not present

## 2020-08-14 DIAGNOSIS — M25561 Pain in right knee: Secondary | ICD-10-CM | POA: Diagnosis not present

## 2020-08-14 DIAGNOSIS — R2689 Other abnormalities of gait and mobility: Secondary | ICD-10-CM | POA: Diagnosis not present

## 2020-08-14 DIAGNOSIS — M25661 Stiffness of right knee, not elsewhere classified: Secondary | ICD-10-CM | POA: Diagnosis not present

## 2020-08-16 DIAGNOSIS — M25561 Pain in right knee: Secondary | ICD-10-CM | POA: Diagnosis not present

## 2020-08-16 DIAGNOSIS — M25661 Stiffness of right knee, not elsewhere classified: Secondary | ICD-10-CM | POA: Diagnosis not present

## 2020-08-16 DIAGNOSIS — M6281 Muscle weakness (generalized): Secondary | ICD-10-CM | POA: Diagnosis not present

## 2020-08-16 DIAGNOSIS — R2689 Other abnormalities of gait and mobility: Secondary | ICD-10-CM | POA: Diagnosis not present

## 2020-08-19 DIAGNOSIS — R2689 Other abnormalities of gait and mobility: Secondary | ICD-10-CM | POA: Diagnosis not present

## 2020-08-19 DIAGNOSIS — M6281 Muscle weakness (generalized): Secondary | ICD-10-CM | POA: Diagnosis not present

## 2020-08-19 DIAGNOSIS — M25661 Stiffness of right knee, not elsewhere classified: Secondary | ICD-10-CM | POA: Diagnosis not present

## 2020-08-19 DIAGNOSIS — M25561 Pain in right knee: Secondary | ICD-10-CM | POA: Diagnosis not present

## 2020-08-22 DIAGNOSIS — M5416 Radiculopathy, lumbar region: Secondary | ICD-10-CM | POA: Diagnosis not present

## 2020-08-22 DIAGNOSIS — M7061 Trochanteric bursitis, right hip: Secondary | ICD-10-CM | POA: Diagnosis not present

## 2020-08-27 DIAGNOSIS — M6281 Muscle weakness (generalized): Secondary | ICD-10-CM | POA: Diagnosis not present

## 2020-08-27 DIAGNOSIS — R2689 Other abnormalities of gait and mobility: Secondary | ICD-10-CM | POA: Diagnosis not present

## 2020-08-27 DIAGNOSIS — M25561 Pain in right knee: Secondary | ICD-10-CM | POA: Diagnosis not present

## 2020-08-27 DIAGNOSIS — M25661 Stiffness of right knee, not elsewhere classified: Secondary | ICD-10-CM | POA: Diagnosis not present

## 2020-08-29 DIAGNOSIS — M6281 Muscle weakness (generalized): Secondary | ICD-10-CM | POA: Diagnosis not present

## 2020-08-29 DIAGNOSIS — M25661 Stiffness of right knee, not elsewhere classified: Secondary | ICD-10-CM | POA: Diagnosis not present

## 2020-08-29 DIAGNOSIS — R2689 Other abnormalities of gait and mobility: Secondary | ICD-10-CM | POA: Diagnosis not present

## 2020-08-29 DIAGNOSIS — M25561 Pain in right knee: Secondary | ICD-10-CM | POA: Diagnosis not present

## 2020-09-02 DIAGNOSIS — M25561 Pain in right knee: Secondary | ICD-10-CM | POA: Diagnosis not present

## 2020-09-02 DIAGNOSIS — R2689 Other abnormalities of gait and mobility: Secondary | ICD-10-CM | POA: Diagnosis not present

## 2020-09-02 DIAGNOSIS — M6281 Muscle weakness (generalized): Secondary | ICD-10-CM | POA: Diagnosis not present

## 2020-09-02 DIAGNOSIS — M25661 Stiffness of right knee, not elsewhere classified: Secondary | ICD-10-CM | POA: Diagnosis not present

## 2020-09-05 DIAGNOSIS — R2689 Other abnormalities of gait and mobility: Secondary | ICD-10-CM | POA: Diagnosis not present

## 2020-09-05 DIAGNOSIS — M25661 Stiffness of right knee, not elsewhere classified: Secondary | ICD-10-CM | POA: Diagnosis not present

## 2020-09-05 DIAGNOSIS — M6281 Muscle weakness (generalized): Secondary | ICD-10-CM | POA: Diagnosis not present

## 2020-09-05 DIAGNOSIS — M5416 Radiculopathy, lumbar region: Secondary | ICD-10-CM | POA: Diagnosis not present

## 2020-09-05 DIAGNOSIS — M25561 Pain in right knee: Secondary | ICD-10-CM | POA: Diagnosis not present

## 2020-09-10 DIAGNOSIS — M25561 Pain in right knee: Secondary | ICD-10-CM | POA: Diagnosis not present

## 2020-09-10 DIAGNOSIS — R2689 Other abnormalities of gait and mobility: Secondary | ICD-10-CM | POA: Diagnosis not present

## 2020-09-10 DIAGNOSIS — M6281 Muscle weakness (generalized): Secondary | ICD-10-CM | POA: Diagnosis not present

## 2020-09-10 DIAGNOSIS — M25661 Stiffness of right knee, not elsewhere classified: Secondary | ICD-10-CM | POA: Diagnosis not present

## 2020-10-28 DIAGNOSIS — M1712 Unilateral primary osteoarthritis, left knee: Secondary | ICD-10-CM | POA: Diagnosis not present

## 2020-11-19 ENCOUNTER — Inpatient Hospital Stay: Payer: Medicare Other

## 2020-11-19 ENCOUNTER — Inpatient Hospital Stay: Payer: Medicare Other | Admitting: Oncology

## 2020-11-20 DIAGNOSIS — S40811A Abrasion of right upper arm, initial encounter: Secondary | ICD-10-CM | POA: Diagnosis not present

## 2020-12-19 DIAGNOSIS — I10 Essential (primary) hypertension: Secondary | ICD-10-CM | POA: Diagnosis not present

## 2020-12-19 DIAGNOSIS — M5136 Other intervertebral disc degeneration, lumbar region: Secondary | ICD-10-CM | POA: Diagnosis not present

## 2020-12-19 DIAGNOSIS — Z79899 Other long term (current) drug therapy: Secondary | ICD-10-CM | POA: Diagnosis not present

## 2020-12-19 DIAGNOSIS — N138 Other obstructive and reflux uropathy: Secondary | ICD-10-CM | POA: Diagnosis not present

## 2020-12-19 DIAGNOSIS — E785 Hyperlipidemia, unspecified: Secondary | ICD-10-CM | POA: Diagnosis not present

## 2020-12-19 DIAGNOSIS — E1169 Type 2 diabetes mellitus with other specified complication: Secondary | ICD-10-CM | POA: Diagnosis not present

## 2020-12-19 DIAGNOSIS — Z0000 Encounter for general adult medical examination without abnormal findings: Secondary | ICD-10-CM | POA: Diagnosis not present

## 2020-12-19 DIAGNOSIS — N401 Enlarged prostate with lower urinary tract symptoms: Secondary | ICD-10-CM | POA: Diagnosis not present

## 2021-01-16 NOTE — Progress Notes (Signed)
[facility]  [address] [place],  [place]  [address] [phone]  Clinic Day:  01/24/2021  Referring physician: Venetia Maxon, Sharon Mt, *   This document serves as a record of services personally performed by Hosie Poisson, MD. It was created on their behalf by Curry,Lauren E, a trained medical scribe. The creation of this record is based on the scribe's personal observations and the provider's statements to them.  CHIEF COMPLAINT:  CC: Stage IIIC right colon cancer diagnosed in January 2018 with recurrence in November 2019   Current Treatment: Surveillance   HISTORY OF PRESENT ILLNESS:  Michael Moyer is a 70 y.o. male with a history of stage IIIC (T4a N2a M0) right colon cancer diagnosed in January 2018.  He was treated with a right hemicolectomy.  Pathology revealed a 6 cm, grade 2, adenocarcinoma with invasion of the visceral peritoneum adherent to the omentum.  Four of 31 nodes were positive for metastasis.  MMR was normal and MSI stable.  Due to his personal and family history of malignancy, he was offered testing for hereditary cancer syndrome, but he declined.  He was also found to have iron deficiency and was placed on hemocyte daily, which was later discontinued.  He received adjuvant chemotherapy with FOLFOX for 12 cycles completed in August 2018.  Colonoscopy in January 2019 did not reveal any abnormality.  Three year follow-up was recommended.  In April 2019, there was an increase in the CEA to 7.8.  CT chest, abdomen and pelvis in May did not reveal evidence of recurrence.  The CEA was then down to 5.4 in July.  He was seen in October for routine follow up and had an another increase in his CEA to 8.4.  CT chest, abdomen and pelvis revealed a 3.7 cm soft tissue density along the anterior margin of the stomach below and to the left of the left hepatic lobe.  In retrospect, this was felt to be present previously, but much smaller.  There  were foci of fat necrosis along the upper omentum, separate from that area.  PET scan revealed intense hypermetabolic activity with an SUV of 7.7 in the the lobular lesion within the lesser omentum.  There were no other findings suspicious for recurrent/metastatic disease.  He saw Dr. Noberto Retort and underwent surgical resection of this mass in November.  Pathology revealed metastatic colorectal adenocarcinoma within a lymph node measuring 4 cm in greatest dimension, with a foci of extranodal extension.  He was seen for follow-up in December and as the recurrence was resected and there was no evidence of disease elsewhere, palliative chemotherapy was not recommended.  We have been following his CEA, which has remained normal. Repeat CT imaging in September 2020 did not reveal any evidence of malignancy.  The prior clustered omental nodules in the upper omentum or no longer visible.  There was a 4 mm hypodense lesion in segment 2 of the liver, not readily seen on imaging in October 2019, but probably subtly visible on imaging in May 2019 and felt to be most likely likely benign, but may merit surveillance.  There was a healing new fracture of the right anterior second rib.  Aortic Atherosclerosis and coronary atherosclerosis were seen.  There was descending and sigmoid colon diverticulosis.  There were old small areas of fat necrosis in the left upper quadrant omentum and right upper quadrant mesentery.  There was chronic pars defects at L5 with impingement at L3-4, L4-5, and especially L5-S1.  CT chest, abdomen and pelvis from September 2021 revealed no findings of active or recurrent malignancy.  Colonoscopy in February 2022 did not reveal any abnormalities.   INTERVAL HISTORY:  Michael Moyer is here for routine follow up and states that he has been well and denies complaints today.  Blood counts are unremarkable.  His  appetite is good, and he has gained 7 and 1/2 pounds since his last visit.  He denies fever, chills  or other signs of infection.  He denies nausea, vomiting, bowel issues, or abdominal pain.  He denies sore throat, cough, dyspnea, or chest pain.  REVIEW OF SYSTEMS:  Review of Systems  Constitutional: Negative.  Negative for appetite change, chills, fatigue, fever and unexpected weight change.  HENT:  Negative.    Eyes: Negative.   Respiratory: Negative.  Negative for chest tightness, cough, hemoptysis, shortness of breath and wheezing.   Cardiovascular: Negative.  Negative for chest pain, leg swelling and palpitations.  Gastrointestinal: Negative.  Negative for abdominal distention, abdominal pain, blood in stool, constipation, diarrhea, nausea and vomiting.  Endocrine: Negative.   Genitourinary: Negative.  Negative for difficulty urinating, dysuria, frequency and hematuria.   Musculoskeletal: Negative.  Negative for arthralgias, back pain, flank pain, gait problem and myalgias.  Skin: Negative.   Neurological:  Negative for dizziness, extremity weakness, gait problem, headaches, light-headedness, numbness, seizures and speech difficulty.  Hematological: Negative.   Psychiatric/Behavioral: Negative.  Negative for depression and sleep disturbance. The patient is not nervous/anxious.   All other systems reviewed and are negative.   VITALS:  Blood pressure (!) 166/76, pulse 67, temperature 97.8 F (36.6 C), temperature source Oral, resp. rate 18, height 5' 6.5" (1.689 m), weight 182 lb 8 oz (82.8 kg), SpO2 98 %.  Wt Readings from Last 3 Encounters:  01/24/21 182 lb 8 oz (82.8 kg)  07/22/20 175 lb (79.4 kg)  02/01/19 183 lb (83 kg)    Body mass index is 29.01 kg/m.  Performance status (ECOG): 0 - Asymptomatic  PHYSICAL EXAM:  Physical Exam Constitutional:      General: He is not in acute distress.    Appearance: Normal appearance. He is normal weight.  HENT:     Head: Normocephalic and atraumatic.  Eyes:     General: No scleral icterus.    Extraocular Movements: Extraocular  movements intact.     Conjunctiva/sclera: Conjunctivae normal.     Pupils: Pupils are equal, round, and reactive to light.  Cardiovascular:     Rate and Rhythm: Normal rate and regular rhythm.     Pulses: Normal pulses.     Heart sounds: Normal heart sounds. No murmur heard.   No friction rub. No gallop.  Pulmonary:     Effort: Pulmonary effort is normal. No respiratory distress.     Breath sounds: Normal breath sounds.  Abdominal:     General: Bowel sounds are normal. There is no distension.     Palpations: Abdomen is soft. There is no hepatomegaly, splenomegaly or mass.     Tenderness: There is no abdominal tenderness.  Musculoskeletal:        General: Normal range of motion.     Cervical back: Normal range of motion and neck supple.     Right lower leg: No edema.     Left lower leg: No edema.  Lymphadenopathy:     Cervical: No cervical adenopathy.  Skin:    General: Skin is warm and dry.  Neurological:     General: No focal deficit present.  Mental Status: He is alert and oriented to person, place, and time. Mental status is at baseline.  Psychiatric:        Mood and Affect: Mood normal.        Behavior: Behavior normal.        Thought Content: Thought content normal.        Judgment: Judgment normal.    LABS:   CBC Latest Ref Rng & Units 01/24/2021 07/22/2020  WBC - 6.6 10.7  Hemoglobin 13.5 - 17.5 15.5 15.3  Hematocrit 41 - 53 48 47  Platelets 150 - 399 152 168   CMP Latest Ref Rng & Units 01/24/2021 07/22/2020  BUN 4 - $R'21 13 13  'YT$ Creatinine 0.6 - 1.3 0.9 0.9  Sodium 137 - 147 138 137  Potassium 3.4 - 5.3 4.6 4.2  Chloride 99 - 108 103 101  CO2 13 - 22 25(A) 27(A)  Calcium 8.7 - 10.7 9.0 8.7  Alkaline Phos 25 - 125 59 49  AST 14 - 40 40 30  ALT 10 - 40 29 31     Lab Results  Component Value Date   CEA1 1.4 07/22/2020   /  CEA  Date Value Ref Range Status  07/22/2020 1.4 0.0 - 4.7 ng/mL Final    Comment:    (NOTE)                             Nonsmokers           <3.9                             Smokers             <5.6 [employer] Electrochemiluminescence Immunoassay (ECLIA) Values obtained with different assay methods or kits cannot be used interchangeably.  Results cannot be interpreted as absolute evidence of the presence or absence of malignant disease. Performed At: [facility] [place], [place] [id number] Rush Farmer MD FB:[id number]     STUDIES:  No results found.   Allergies:  Allergies  Allergen Reactions   Ciprofloxacin Other (See Comments)    Unknown   Metronidazole Other (See Comments)    Unknown   Prednisone Other (See Comments)    Unknown   Promethazine Other (See Comments)    hysterics    Current Medications: Current Outpatient Medications  Medication Sig Dispense Refill   atorvastatin (LIPITOR) 40 MG tablet Take 1 tablet by mouth daily.     gabapentin (NEURONTIN) 300 MG capsule Take 1 capsule (300 mg total) by mouth 2 (two) times daily. 60 capsule 2   lisinopril (ZESTRIL) 10 MG tablet Take 1 tablet by mouth daily.     metFORMIN (GLUCOPHAGE) 500 MG tablet Take 1 tablet by mouth 2 (two) times daily.     naproxen sodium (ALEVE) 220 MG tablet Take by mouth.     nitroGLYCERIN (NITROSTAT) 0.4 MG SL tablet Place 1 tablet (0.4 mg total) under the tongue every 5 (five) minutes as needed. 25 tablet 3   No current facility-administered medications for this visit.     ASSESSMENT & PLAN:   Assessment:   1. History of stage IIIC colon cancer diagnosed in January 2018, treated with surgery and adjuvant FOLFOX chemotherapy. Repeat colonoscopy in 2022 was normal. It is recommended he repeat this in 5 years (2027).  2. Recurrent colon cancer in an abdominal lymph node  in November 2019, which was surgically resected.  As this was the only site of disease, palliative chemotherapy was not recommended.  He has been on observation for 2 1/2  years.  CEA has remained normal and CT imaging in  September 2021 did not reveal any evidence of malignancy.   Plan: I will call him with the results of his chemistries and CEA.  We will plan to see him back in 3 months with a CBC, comprehensive metabolic panel, CEA and CT chest, abdomen and pelvis for repeat examination.  If all is well at that appointment, we can starting spreading his appointments out.  The patient and his wife understand the plans discussed today and are in agreement with them.  They know to contact our office if they develop concerns prior to his next appointment.  Derwood Kaplan, MD [facility] AT [facility] [address] [place] [address] Dept: [phone] Dept Fax: [phone]   I, Rita Ohara, am acting as scribe for Derwood Kaplan, MD  I have reviewed this report as typed by the medical scribe, and it is complete and accurate.

## 2021-01-23 ENCOUNTER — Other Ambulatory Visit: Payer: Self-pay | Admitting: Oncology

## 2021-01-23 DIAGNOSIS — C184 Malignant neoplasm of transverse colon: Secondary | ICD-10-CM

## 2021-01-24 ENCOUNTER — Other Ambulatory Visit: Payer: Self-pay

## 2021-01-24 ENCOUNTER — Encounter: Payer: Self-pay | Admitting: Oncology

## 2021-01-24 ENCOUNTER — Inpatient Hospital Stay: Payer: Medicare Other | Attending: Oncology

## 2021-01-24 ENCOUNTER — Telehealth: Payer: Self-pay | Admitting: Oncology

## 2021-01-24 ENCOUNTER — Inpatient Hospital Stay (INDEPENDENT_AMBULATORY_CARE_PROVIDER_SITE_OTHER): Payer: Medicare Other | Admitting: Oncology

## 2021-01-24 ENCOUNTER — Other Ambulatory Visit: Payer: Self-pay | Admitting: Oncology

## 2021-01-24 VITALS — BP 166/76 | HR 67 | Temp 97.8°F | Resp 18 | Ht 66.5 in | Wt 182.5 lb

## 2021-01-24 DIAGNOSIS — I700 Atherosclerosis of aorta: Secondary | ICD-10-CM | POA: Insufficient documentation

## 2021-01-24 DIAGNOSIS — C184 Malignant neoplasm of transverse colon: Secondary | ICD-10-CM

## 2021-01-24 DIAGNOSIS — Z79899 Other long term (current) drug therapy: Secondary | ICD-10-CM | POA: Insufficient documentation

## 2021-01-24 DIAGNOSIS — K654 Sclerosing mesenteritis: Secondary | ICD-10-CM | POA: Diagnosis not present

## 2021-01-24 DIAGNOSIS — C182 Malignant neoplasm of ascending colon: Secondary | ICD-10-CM | POA: Insufficient documentation

## 2021-01-24 DIAGNOSIS — Z888 Allergy status to other drugs, medicaments and biological substances status: Secondary | ICD-10-CM | POA: Diagnosis not present

## 2021-01-24 DIAGNOSIS — I2510 Atherosclerotic heart disease of native coronary artery without angina pectoris: Secondary | ICD-10-CM | POA: Diagnosis not present

## 2021-01-24 DIAGNOSIS — Z881 Allergy status to other antibiotic agents status: Secondary | ICD-10-CM | POA: Insufficient documentation

## 2021-01-24 LAB — CBC AND DIFFERENTIAL
HCT: 48 (ref 41–53)
Hemoglobin: 15.5 (ref 13.5–17.5)
Neutrophils Absolute: 3.23
Platelets: 152 (ref 150–399)
WBC: 6.6

## 2021-01-24 LAB — BASIC METABOLIC PANEL
BUN: 13 (ref 4–21)
CO2: 25 — AB (ref 13–22)
Chloride: 103 (ref 99–108)
Creatinine: 0.9 (ref 0.6–1.3)
Glucose: 171
Potassium: 4.6 (ref 3.4–5.3)
Sodium: 138 (ref 137–147)

## 2021-01-24 LAB — HEPATIC FUNCTION PANEL
ALT: 29 (ref 10–40)
AST: 40 (ref 14–40)
Alkaline Phosphatase: 59 (ref 25–125)
Bilirubin, Total: 0.9

## 2021-01-24 LAB — CBC: RBC: 5.41 — AB (ref 3.87–5.11)

## 2021-01-24 LAB — COMPREHENSIVE METABOLIC PANEL
Albumin: 4 (ref 3.5–5.0)
Calcium: 9 (ref 8.7–10.7)

## 2021-01-24 NOTE — Telephone Encounter (Signed)
Per 8/12 LOS next appt scheduled and given to patient

## 2021-01-26 LAB — CEA: CEA: 1.7 ng/mL (ref 0.0–4.7)

## 2021-01-28 ENCOUNTER — Telehealth: Payer: Self-pay

## 2021-01-28 NOTE — Telephone Encounter (Signed)
-----   Message from Derwood Kaplan, MD sent at 01/27/2021 12:28 PM EDT ----- Regarding: call pt Tell him CEA remains normal at 1.7

## 2021-01-28 NOTE — Telephone Encounter (Signed)
Patient notified

## 2021-01-29 ENCOUNTER — Encounter: Payer: Self-pay | Admitting: Oncology

## 2021-01-31 DIAGNOSIS — M1712 Unilateral primary osteoarthritis, left knee: Secondary | ICD-10-CM | POA: Diagnosis not present

## 2021-03-04 ENCOUNTER — Other Ambulatory Visit: Payer: Self-pay | Admitting: Hematology and Oncology

## 2021-03-04 DIAGNOSIS — G620 Drug-induced polyneuropathy: Secondary | ICD-10-CM

## 2021-03-04 DIAGNOSIS — T451X5A Adverse effect of antineoplastic and immunosuppressive drugs, initial encounter: Secondary | ICD-10-CM

## 2021-03-14 DIAGNOSIS — E7800 Pure hypercholesterolemia, unspecified: Secondary | ICD-10-CM | POA: Diagnosis not present

## 2021-03-14 DIAGNOSIS — I10 Essential (primary) hypertension: Secondary | ICD-10-CM | POA: Diagnosis not present

## 2021-04-03 DIAGNOSIS — Z23 Encounter for immunization: Secondary | ICD-10-CM | POA: Diagnosis not present

## 2021-04-21 NOTE — Progress Notes (Signed)
Michael Moyer  [address] [place],  [place]  [address] [phone]  Clinic Day:  04/28/2021  Referring physician: Venetia Maxon, Sharon Mt, *   This document serves as a record of services personally performed by Hosie Poisson, MD. It was created on their behalf by Curry,Lauren E, a trained medical scribe. The creation of this record is based on the scribe's personal observations and the provider's statements to them.  CHIEF COMPLAINT:  CC: Stage IIIC right colon cancer diagnosed in January 2018 with recurrence in November 2019   Current Treatment: Surveillance   HISTORY OF PRESENT ILLNESS:  Michael Moyer is a 70 y.o. male with a history of stage IIIC (T4a N2a M0) right colon cancer diagnosed in January 2018.  He was treated with a right hemicolectomy.  Pathology revealed a 6 cm, grade 2, adenocarcinoma with invasion of the visceral peritoneum adherent to the omentum.  Four of 31 nodes were positive for metastasis.  MMR was normal and MSI stable.  Due to his personal and family history of malignancy, he was offered testing for hereditary cancer syndrome, but he declined.  He was also found to have iron deficiency and was placed on hemocyte daily, which was later discontinued.  He received adjuvant chemotherapy with FOLFOX for 12 cycles completed in August 2018.  Colonoscopy in January 2019 did not reveal any abnormality.  Three year follow-up was recommended.  In April 2019, there was an increase in the CEA to 7.8.  CT chest, abdomen and pelvis in May did not reveal evidence of recurrence.  The CEA was then down to 5.4 in July.  He was seen in October for routine follow up and had an another increase in his CEA to 8.4.  CT chest, abdomen and pelvis revealed a 3.7 cm soft tissue density along the anterior margin of the stomach below and to the left of the left hepatic lobe.  In retrospect, this was felt to be present previously, but much smaller.  There  were foci of fat necrosis along the upper omentum, separate from that area.  PET scan revealed intense hypermetabolic activity with an SUV of 7.7 in the the lobular lesion within the lesser omentum.  There were no other findings suspicious for recurrent/metastatic disease.  He saw Dr. Noberto Retort and underwent surgical resection of this mass in November.  Pathology revealed metastatic colorectal adenocarcinoma within a lymph node measuring 4 cm in greatest dimension, with a foci of extranodal extension.  He was seen for follow-up in December and as the recurrence was resected and there was no evidence of disease elsewhere, palliative chemotherapy was not recommended.  We have been following his CEA, which has remained normal. Repeat CT imaging in September 2020 did not reveal any evidence of malignancy.  The prior clustered omental nodules in the upper omentum or no longer visible.  There was a 4 mm hypodense lesion in segment 2 of the liver, not readily seen on imaging in October 2019, but probably subtly visible on imaging in May 2019 and felt to be most likely likely benign, but may merit surveillance.  There was a healing new fracture of the right anterior second rib.  Aortic Atherosclerosis and coronary atherosclerosis were seen.  There was descending and sigmoid colon diverticulosis.  There were old small areas of fat necrosis in the left upper quadrant omentum and right upper quadrant mesentery.  There was chronic pars defects at L5 with impingement at L3-4, L4-5, and especially L5-S1.  CT chest, abdomen and pelvis from September 2021 revealed no findings of active or recurrent malignancy.  Colonoscopy in February 2022 did not reveal any abnormalities.   INTERVAL HISTORY:  Michael Moyer is here for routine follow up and states that he has been well other than diarrhea for the past 3-4 days which can be watery and occasionally severe. CT chest, abdomen and pelvis from November 11th revealed no evidence of local  colorectal carcinoma recurrence or evidence of metastatic disease. Blood counts and chemistries are unremarkable except for a fasting blood glucose of 188. His  appetite is good, and he has gained 1 and 1/2 pounds since his last visit.  He denies fever, chills or other signs of infection.  He denies nausea, vomiting, bowel issues, or abdominal pain.  He denies sore throat, cough, dyspnea, or chest pain.  REVIEW OF SYSTEMS:  Review of Systems  Constitutional: Negative.  Negative for appetite change, chills, fatigue, fever and unexpected weight change.  HENT:  Negative.    Eyes: Negative.   Respiratory: Negative.  Negative for chest tightness, cough, hemoptysis, shortness of breath and wheezing.   Cardiovascular: Negative.  Negative for chest pain, leg swelling and palpitations.  Gastrointestinal:  Positive for diarrhea. Negative for abdominal distention, abdominal pain, blood in stool, constipation, nausea and vomiting.  Endocrine: Negative.   Genitourinary: Negative.  Negative for difficulty urinating, dysuria, frequency and hematuria.   Musculoskeletal: Negative.  Negative for arthralgias, back pain, flank pain, gait problem and myalgias.  Skin: Negative.   Neurological:  Negative for dizziness, extremity weakness, gait problem, headaches, light-headedness, numbness, seizures and speech difficulty.  Hematological: Negative.   Psychiatric/Behavioral: Negative.  Negative for depression and sleep disturbance. The patient is not nervous/anxious.   All other systems reviewed and are negative.   VITALS:  Blood pressure (!) 158/71, pulse 69, temperature 97.6 F (36.4 C), temperature source Oral, resp. rate 18, height 5' 6.5" (1.689 m), weight 184 lb 8 oz (83.7 kg), SpO2 96 %.  Wt Readings from Last 3 Encounters:  04/28/21 184 lb 8 oz (83.7 kg)  01/24/21 182 lb 8 oz (82.8 kg)  07/22/20 175 lb (79.4 kg)    Body mass index is 29.33 kg/m.  Performance status (ECOG): 1 - Symptomatic but completely  ambulatory  PHYSICAL EXAM:  Physical Exam Constitutional:      General: He is not in acute distress.    Appearance: Normal appearance. He is normal weight.  HENT:     Head: Normocephalic and atraumatic.  Eyes:     General: No scleral icterus.    Extraocular Movements: Extraocular movements intact.     Conjunctiva/sclera: Conjunctivae normal.     Pupils: Pupils are equal, round, and reactive to light.  Cardiovascular:     Rate and Rhythm: Normal rate and regular rhythm.     Pulses: Normal pulses.     Heart sounds: Normal heart sounds. No murmur heard.   No friction rub. No gallop.  Pulmonary:     Effort: Pulmonary effort is normal. No respiratory distress.     Breath sounds: Normal breath sounds.  Abdominal:     General: Bowel sounds are normal. There is no distension.     Palpations: Abdomen is soft. There is no hepatomegaly, splenomegaly or mass.     Tenderness: There is no abdominal tenderness.  Musculoskeletal:        General: Normal range of motion.     Cervical back: Normal range of motion and neck supple.  Right lower leg: No edema.     Left lower leg: No edema.  Lymphadenopathy:     Cervical: No cervical adenopathy.  Skin:    General: Skin is warm and dry.  Neurological:     General: No focal deficit present.     Mental Status: He is alert and oriented to person, place, and time. Mental status is at baseline.  Psychiatric:        Mood and Affect: Mood normal.        Behavior: Behavior normal.        Thought Content: Thought content normal.        Judgment: Judgment normal.    LABS:   CBC Latest Ref Rng & Units 04/28/2021 01/24/2021 07/22/2020  WBC - 8.7 6.6 10.7  Hemoglobin 13.5 - 17.5 15.9 15.5 15.3  Hematocrit 41 - 53 49 48 47  Platelets 150 - 399 137(A) 152 168   CMP Latest Ref Rng & Units 04/28/2021 01/24/2021 07/22/2020  BUN 4 - _0 Creatinine 0.6 - 1.3 0.8 0.9 0.9  Sodium 137 - 147 138 138 137  Potassium 3.4 - 5.3 4.5 4.6 4.2  Chloride 99 -  108 105 103 101  CO2 13 - 22 25(A) 25(A) 27(A)  Calcium 8.7 - 10.7 9.2 9.0 8.7  Alkaline Phos 25 - 125 54 59 49  AST 14 - 40 28 40 30  ALT 10 - 40 33 29 31     Lab Results  Component Value Date   CEA1 1.7 01/24/2021   /  CEA  Date Value Ref Range Status  01/24/2021 1.7 0.0 - 4.7 ng/mL Final    Comment:    (NOTE)                             Nonsmokers          <3.9                             Smokers             <5.6 [employer] Electrochemiluminescence Immunoassay (ECLIA) Values obtained with different assay methods or kits cannot be used interchangeably.  Results cannot be interpreted as absolute evidence of the presence or absence of malignant disease. Performed At: [facility] [place], [place] [id number] Rush Farmer MD MV:[id number]     STUDIES:  No results found.   EXAM: 04/25/2021 CT CHEST, ABDOMEN, AND PELVIS WITH CONTRAST  TECHNIQUE: Multidetector CT imaging of the chest, abdomen and pelvis was performed following the standard protocol during bolus administration of intravenous contrast.  CONTRAST: 100 cc Isovue  COMPARISON: CT 03/08/2020  FINDINGS: CT CHEST FINDINGS Cardiovascular: No significant vascular findings. Normal heart size. No pericardial effusion. Mediastinum/Nodes: No axillary or supraclavicular adenopathy. No mediastinal or hilar adenopathy. No pericardial fluid. Esophagus normal. Lungs/Pleura: No suspicious pulmonary nodules. Musculoskeletal: No aggressive osseous lesion.  CT ABDOMEN AND PELVIS FINDINGS Hepatobiliary: No focal hepatic lesion. Postcholecystectomy. No biliary dilatation. Pancreas: Pancreas is normal. No ductal dilatation. No pancreatic inflammation. Spleen: Normal spleen Adrenals/urinary tract: Adrenal glands and kidneys are normal. The ureters and bladder normal. Stomach/Bowel: Stomach, duodenum small-bowel normal. Post RIGHT hemicolectomy. Transverse colon and descending colon  normal. Diverticular disease sigmoid colon without acute inflammation. No mesenteric adenopathy. No omental adenopathy. Vascular/Lymphatic: Abdominal aorta is normal caliber. There is no retroperitoneal or periportal lymphadenopathy. No pelvic  lymphadenopathy. Reproductive: Prostate unremarkable Other: No free fluid. Musculoskeletal: No aggressive osseous lesion.  IMPRESSION: 1. No evidence of local colorectal carcinoma recurrence in the abdomen. 2. No evidence of metastatic disease in the chest abdomen or pelvis. 3. LEFT colon diverticulosis without evidence diverticulitis. 4. Coronary artery calcification and Aortic Atherosclerosis (ICD10-I70.0).  Allergies:  Allergies  Allergen Reactions   Ciprofloxacin Other (See Comments)    Unknown   Metronidazole Other (See Comments)    Unknown   Prednisone Other (See Comments)    Unknown   Promethazine Other (See Comments)    hysterics    Current Medications: Current Outpatient Medications  Medication Sig Dispense Refill   gabapentin (NEURONTIN) 300 MG capsule TAKE 1 CAPSULE(300 MG) BY MOUTH TWICE DAILY 60 capsule 2   atorvastatin (LIPITOR) 40 MG tablet Take 1 tablet by mouth daily.     lisinopril (ZESTRIL) 10 MG tablet Take 1 tablet by mouth daily.     metFORMIN (GLUCOPHAGE) 500 MG tablet Take 1 tablet by mouth 2 (two) times daily.     naproxen sodium (ALEVE) 220 MG tablet Take by mouth.     nitroGLYCERIN (NITROSTAT) 0.4 MG SL tablet Place 1 tablet (0.4 mg total) under the tongue every 5 (five) minutes as needed. 25 tablet 3   No current facility-administered medications for this visit.     ASSESSMENT & PLAN:   Assessment:   1. History of stage IIIC colon cancer diagnosed in January 2018, treated with surgery and adjuvant FOLFOX chemotherapy. Repeat colonoscopy in 2022 was normal. It is recommended he repeat this in 5 years (2027).  2. Recurrent colon cancer in an abdominal lymph node in November 2019, which was surgically  resected.  As this was the only site of disease, palliative chemotherapy was not recommended.  He has been on observation for 3 years.  CEA has remained normal and CT imaging in November 2022 did not reveal any evidence of malignancy.   3. Hyperglycemia. I advised that he follow up with Dr. Venetia Maxon and address his blood pressure as well.  4. Hypertension.  Plan: CT imaging remains without evidence of recurrence. I will call him with the CEA results.  We will plan to see him back in 4 months with a CBC, comprehensive metabolic panel and CEA for repeat examination.  We will plan to repeat imaging again in 1 year unless otherwise indicated.The patient and his wife understand the plans discussed today and are in agreement with them.  They know to contact our office if they develop concerns prior to his next appointment.  I provided 20 minutes of face-to-face time during this this encounter and > 50% was spent counseling as documented under my assessment and plan.   ADDENDUM: CEA is normal at 2.1.   Derwood Kaplan, MD [facility] AT [facility] [address] [place] [address] Dept: [phone] Dept Fax: [phone]   I, Rita Ohara, am acting as scribe for Derwood Kaplan, MD  I have reviewed this report as typed by the medical scribe, and it is complete and accurate.

## 2021-04-25 ENCOUNTER — Encounter: Payer: Self-pay | Admitting: Oncology

## 2021-04-25 DIAGNOSIS — I700 Atherosclerosis of aorta: Secondary | ICD-10-CM | POA: Diagnosis not present

## 2021-04-25 DIAGNOSIS — K5730 Diverticulosis of large intestine without perforation or abscess without bleeding: Secondary | ICD-10-CM | POA: Diagnosis not present

## 2021-04-25 DIAGNOSIS — C184 Malignant neoplasm of transverse colon: Secondary | ICD-10-CM | POA: Diagnosis not present

## 2021-04-25 DIAGNOSIS — C19 Malignant neoplasm of rectosigmoid junction: Secondary | ICD-10-CM | POA: Diagnosis not present

## 2021-04-25 DIAGNOSIS — I2510 Atherosclerotic heart disease of native coronary artery without angina pectoris: Secondary | ICD-10-CM | POA: Diagnosis not present

## 2021-04-25 DIAGNOSIS — C189 Malignant neoplasm of colon, unspecified: Secondary | ICD-10-CM | POA: Diagnosis not present

## 2021-04-25 LAB — HEPATIC FUNCTION PANEL
ALT: 33 (ref 10–40)
AST: 28 (ref 14–40)
Alkaline Phosphatase: 54 (ref 25–125)
Bilirubin, Total: 0.9

## 2021-04-25 LAB — BASIC METABOLIC PANEL
BUN: 12 (ref 4–21)
CO2: 25 — AB (ref 13–22)
Chloride: 105 (ref 99–108)
Creatinine: 0.8 (ref 0.6–1.3)
Glucose: 188
Potassium: 4.5 (ref 3.4–5.3)
Sodium: 138 (ref 137–147)

## 2021-04-25 LAB — CBC: RBC: 5.52 — AB (ref 3.87–5.11)

## 2021-04-25 LAB — CBC AND DIFFERENTIAL
HCT: 49 (ref 41–53)
Hemoglobin: 15.9 (ref 13.5–17.5)
Neutrophils Absolute: 5.83
Platelets: 137 — AB (ref 150–399)
WBC: 8.7

## 2021-04-25 LAB — COMPREHENSIVE METABOLIC PANEL
Albumin: 4 (ref 3.5–5.0)
Calcium: 9.2 (ref 8.7–10.7)

## 2021-04-28 ENCOUNTER — Other Ambulatory Visit: Payer: Self-pay

## 2021-04-28 ENCOUNTER — Inpatient Hospital Stay: Payer: Medicare Other | Attending: Oncology | Admitting: Oncology

## 2021-04-28 ENCOUNTER — Encounter: Payer: Self-pay | Admitting: Oncology

## 2021-04-28 ENCOUNTER — Other Ambulatory Visit: Payer: Self-pay | Admitting: Oncology

## 2021-04-28 ENCOUNTER — Telehealth: Payer: Self-pay | Admitting: Oncology

## 2021-04-28 VITALS — BP 158/71 | HR 69 | Temp 97.6°F | Resp 18 | Ht 66.5 in | Wt 184.5 lb

## 2021-04-28 DIAGNOSIS — C184 Malignant neoplasm of transverse colon: Secondary | ICD-10-CM

## 2021-04-28 DIAGNOSIS — C189 Malignant neoplasm of colon, unspecified: Secondary | ICD-10-CM

## 2021-04-28 DIAGNOSIS — C772 Secondary and unspecified malignant neoplasm of intra-abdominal lymph nodes: Secondary | ICD-10-CM | POA: Diagnosis not present

## 2021-04-28 LAB — BASIC METABOLIC PANEL
BUN: 12 (ref 4–21)
CO2: 25 — AB (ref 13–22)
Chloride: 105 (ref 99–108)
Creatinine: 0.8 (ref 0.6–1.3)
Glucose: 188
Potassium: 4.5 (ref 3.4–5.3)
Sodium: 138 (ref 137–147)

## 2021-04-28 LAB — HEPATIC FUNCTION PANEL
ALT: 33 (ref 10–40)
AST: 28 (ref 14–40)
Alkaline Phosphatase: 54 (ref 25–125)
Bilirubin, Total: 0.9

## 2021-04-28 LAB — COMPREHENSIVE METABOLIC PANEL
Albumin: 4 (ref 3.5–5.0)
Calcium: 9.2 (ref 8.7–10.7)

## 2021-04-28 LAB — CBC AND DIFFERENTIAL
HCT: 49 (ref 41–53)
Hemoglobin: 15.9 (ref 13.5–17.5)
Neutrophils Absolute: 5.83
Platelets: 137 — AB (ref 150–399)
WBC: 8.7

## 2021-04-28 LAB — CBC: RBC: 5.52 — AB (ref 3.87–5.11)

## 2021-04-28 NOTE — Telephone Encounter (Signed)
Per 11/14 los next appt scheduled and confirmed with patient

## 2021-04-30 ENCOUNTER — Encounter: Payer: Self-pay | Admitting: Oncology

## 2021-05-02 DIAGNOSIS — M1712 Unilateral primary osteoarthritis, left knee: Secondary | ICD-10-CM | POA: Diagnosis not present

## 2021-05-16 DIAGNOSIS — J302 Other seasonal allergic rhinitis: Secondary | ICD-10-CM | POA: Diagnosis not present

## 2021-05-16 DIAGNOSIS — J40 Bronchitis, not specified as acute or chronic: Secondary | ICD-10-CM | POA: Diagnosis not present

## 2021-05-16 DIAGNOSIS — Z20828 Contact with and (suspected) exposure to other viral communicable diseases: Secondary | ICD-10-CM | POA: Diagnosis not present

## 2021-05-16 DIAGNOSIS — J329 Chronic sinusitis, unspecified: Secondary | ICD-10-CM | POA: Diagnosis not present

## 2021-06-02 ENCOUNTER — Other Ambulatory Visit: Payer: Self-pay | Admitting: Hematology and Oncology

## 2021-06-02 DIAGNOSIS — T451X5A Adverse effect of antineoplastic and immunosuppressive drugs, initial encounter: Secondary | ICD-10-CM

## 2021-06-02 DIAGNOSIS — G620 Drug-induced polyneuropathy: Secondary | ICD-10-CM

## 2021-07-08 DIAGNOSIS — M1712 Unilateral primary osteoarthritis, left knee: Secondary | ICD-10-CM | POA: Diagnosis not present

## 2021-07-09 DIAGNOSIS — E559 Vitamin D deficiency, unspecified: Secondary | ICD-10-CM | POA: Diagnosis not present

## 2021-07-09 DIAGNOSIS — M79609 Pain in unspecified limb: Secondary | ICD-10-CM | POA: Diagnosis not present

## 2021-07-09 DIAGNOSIS — Z79899 Other long term (current) drug therapy: Secondary | ICD-10-CM | POA: Diagnosis not present

## 2021-07-09 DIAGNOSIS — R52 Pain, unspecified: Secondary | ICD-10-CM | POA: Diagnosis not present

## 2021-07-09 DIAGNOSIS — Z01818 Encounter for other preprocedural examination: Secondary | ICD-10-CM | POA: Diagnosis not present

## 2021-07-14 DIAGNOSIS — E1169 Type 2 diabetes mellitus with other specified complication: Secondary | ICD-10-CM | POA: Diagnosis not present

## 2021-07-14 DIAGNOSIS — E785 Hyperlipidemia, unspecified: Secondary | ICD-10-CM | POA: Diagnosis not present

## 2021-07-14 DIAGNOSIS — Z01818 Encounter for other preprocedural examination: Secondary | ICD-10-CM | POA: Diagnosis not present

## 2021-07-14 DIAGNOSIS — M1712 Unilateral primary osteoarthritis, left knee: Secondary | ICD-10-CM | POA: Diagnosis not present

## 2021-07-17 ENCOUNTER — Ambulatory Visit (INDEPENDENT_AMBULATORY_CARE_PROVIDER_SITE_OTHER): Payer: Medicare Other | Admitting: Podiatry

## 2021-07-17 ENCOUNTER — Ambulatory Visit (INDEPENDENT_AMBULATORY_CARE_PROVIDER_SITE_OTHER): Payer: Medicare Other

## 2021-07-17 ENCOUNTER — Encounter: Payer: Self-pay | Admitting: Podiatry

## 2021-07-17 ENCOUNTER — Other Ambulatory Visit: Payer: Self-pay | Admitting: *Deleted

## 2021-07-17 DIAGNOSIS — M778 Other enthesopathies, not elsewhere classified: Secondary | ICD-10-CM | POA: Diagnosis not present

## 2021-07-17 DIAGNOSIS — G588 Other specified mononeuropathies: Secondary | ICD-10-CM | POA: Diagnosis not present

## 2021-07-17 MED ORDER — BETAMETHASONE SOD PHOS & ACET 6 (3-3) MG/ML IJ SUSP
6.0000 mg | Freq: Once | INTRAMUSCULAR | Status: AC
  Administered 2021-07-17: 6 mg

## 2021-07-17 NOTE — Progress Notes (Signed)
°  Subjective:  Patient ID: Michael Moyer, male    DOB: 1950-10-27,  MRN: [id number]  Chief Complaint  Patient presents with   Foot Pain    The right foot hurts on top and I have had an injection that Dr March Rummage has given me and does help and I have taken aleve and I have arthritis and I am a diabetic and this has been going on for about 6 months    71 y.o. male presents with the above complaint. History confirmed with patient.   Objective:  Physical Exam: warm, good capillary refill, no trophic changes or ulcerative lesions, normal DP and PT pulses, and normal sensory exam.  Right Foot: POP right 2nd MPJ, mild pain 1st interspace. No mulder's click 1st interspace   No images are attached to the encounter.  Radiographs: X-ray of the right foot: no fracture, dislocation, swelling or degenerative changes noted Assessment:   1. Interdigital neuroma   2. Capsulitis of foot, right    Plan:  Patient was evaluated and treated and all questions answered.  Capsulitis -Educated on etiology -Injection delivered to the painful joint. -Patient to follow-up should pain persist  Procedure: Joint Injection Location: Right 2nd MPJ joint Skin Prep: Alcohol. Injectate: 0.5 cc 1% lidocaine plain, 0.5 cc betamethasone acetate-betamethasone sodium phosphate Disposition: Patient tolerated procedure well. Injection site dressed with a band-aid.  No follow-ups on file.

## 2021-08-14 DIAGNOSIS — Z20822 Contact with and (suspected) exposure to covid-19: Secondary | ICD-10-CM | POA: Diagnosis not present

## 2021-08-14 DIAGNOSIS — Z20828 Contact with and (suspected) exposure to other viral communicable diseases: Secondary | ICD-10-CM | POA: Diagnosis not present

## 2021-08-26 ENCOUNTER — Other Ambulatory Visit: Payer: Self-pay

## 2021-08-26 ENCOUNTER — Inpatient Hospital Stay (INDEPENDENT_AMBULATORY_CARE_PROVIDER_SITE_OTHER): Payer: Medicare Other | Admitting: Hematology and Oncology

## 2021-08-26 ENCOUNTER — Encounter: Payer: Self-pay | Admitting: Hematology and Oncology

## 2021-08-26 ENCOUNTER — Other Ambulatory Visit: Payer: Medicare Other

## 2021-08-26 ENCOUNTER — Inpatient Hospital Stay: Payer: Medicare Other | Attending: Hematology and Oncology

## 2021-08-26 ENCOUNTER — Telehealth: Payer: Self-pay

## 2021-08-26 ENCOUNTER — Ambulatory Visit: Payer: Medicare Other | Admitting: Hematology and Oncology

## 2021-08-26 DIAGNOSIS — C184 Malignant neoplasm of transverse colon: Secondary | ICD-10-CM

## 2021-08-26 DIAGNOSIS — C772 Secondary and unspecified malignant neoplasm of intra-abdominal lymph nodes: Secondary | ICD-10-CM | POA: Insufficient documentation

## 2021-08-26 DIAGNOSIS — C189 Malignant neoplasm of colon, unspecified: Secondary | ICD-10-CM | POA: Diagnosis not present

## 2021-08-26 DIAGNOSIS — Z888 Allergy status to other drugs, medicaments and biological substances status: Secondary | ICD-10-CM | POA: Insufficient documentation

## 2021-08-26 DIAGNOSIS — D649 Anemia, unspecified: Secondary | ICD-10-CM | POA: Diagnosis not present

## 2021-08-26 DIAGNOSIS — R791 Abnormal coagulation profile: Secondary | ICD-10-CM | POA: Diagnosis not present

## 2021-08-26 DIAGNOSIS — Z881 Allergy status to other antibiotic agents status: Secondary | ICD-10-CM | POA: Insufficient documentation

## 2021-08-26 DIAGNOSIS — Z79899 Other long term (current) drug therapy: Secondary | ICD-10-CM | POA: Insufficient documentation

## 2021-08-26 LAB — HEPATIC FUNCTION PANEL
ALT: 39 U/L (ref 10–40)
AST: 29 (ref 14–40)
Alkaline Phosphatase: 52 (ref 25–125)
Bilirubin, Total: 1.1

## 2021-08-26 LAB — CBC AND DIFFERENTIAL
HCT: 47 (ref 41–53)
Hemoglobin: 15.1 (ref 13.5–17.5)
Neutrophils Absolute: 3.43
Platelets: 144 10*3/uL — AB (ref 150–400)
WBC: 6.6

## 2021-08-26 LAB — COMPREHENSIVE METABOLIC PANEL
Albumin: 3.7 (ref 3.5–5.0)
Calcium: 8.8 (ref 8.7–10.7)

## 2021-08-26 LAB — BASIC METABOLIC PANEL
BUN: 12 (ref 4–21)
CO2: 26 — AB (ref 13–22)
Chloride: 102 (ref 99–108)
Creatinine: 0.9 (ref 0.6–1.3)
Glucose: 251
Potassium: 4.5 mEq/L (ref 3.5–5.1)
Sodium: 136 — AB (ref 137–147)

## 2021-08-26 LAB — CBC: RBC: 5.3 — AB (ref 3.87–5.11)

## 2021-08-26 LAB — PROTIME-INR: INR: 1 (ref 0.80–1.20)

## 2021-08-26 NOTE — Progress Notes (Addendum)
?Patient Care Team: ?Street, Sharon Mt, MD as PCP - General (Family Medicine) ?Derwood Kaplan, MD as Consulting Physician (Oncology) ? ?Clinic Day:  08/26/2021 ? ?Referring physician: Street, Sharon Mt, * ? ?ASSESSMENT & PLAN:  ? ?Assessment & Plan: ?Malignant neoplasm of transverse colon ([place]) ?History of stage IIIC colon cancer diagnosed in January 2018, treated with surgery and adjuvant FOLFOX chemotherapy. ? ?Recurrence of colon cancer in abdominal lymph node November 2019 ?This was resected with surgery and he has not had further recurrence. ? ? ?Repeat colonoscopy in 2022 was normal. It is recommended he repeat this in 5 years (2027). CEA from August was 1.7, results are pending for today. The patient understands the plans discussed today and is in agreement with them.  He knows to contact our office if he develops concerns prior to his next appointment. ? ? ? ?Melodye Ped, NP  ?[address] [address] ?Dept: [phone] ?Dept Fax: [phone]  ? ?No orders of the defined types were placed in this encounter. ?  ? ? ?CHIEF COMPLAINT:  ?CC: A 71 year old male with history of colon cancer here for 4 month evaluation ? ?Current Treatment:  Surveillance ? ?INTERVAL HISTORY:  ?Michael Moyer is here today for repeat clinical assessment. He denies fevers or chills. He denies pain. His appetite is good. His weight has increased 7 pounds over last 4 months . ? ?I have reviewed the past medical history, past surgical history, social history and family history with the patient and they are unchanged from previous note. ? ?ALLERGIES:  is allergic to ciprofloxacin, metronidazole, prednisone, and promethazine. ? ?MEDICATIONS:  ?Current Outpatient Medications  ?Medication Sig Dispense Refill  ? amoxicillin (AMOXIL) 500 MG capsule Take by mouth.    ? atorvastatin (LIPITOR) 40 MG tablet Take 1 tablet by mouth daily.    ?  gabapentin (NEURONTIN) 300 MG capsule TAKE 1 CAPSULE(300 MG) BY MOUTH TWICE DAILY 60 capsule 2  ? lisinopril (ZESTRIL) 10 MG tablet Take 1 tablet by mouth daily.    ? metFORMIN (GLUCOPHAGE) 500 MG tablet Take 1 tablet by mouth 2 (two) times daily.    ? naproxen sodium (ALEVE) 220 MG tablet Take by mouth.    ? nitroGLYCERIN (NITROSTAT) 0.4 MG SL tablet Place 1 tablet (0.4 mg total) under the tongue every 5 (five) minutes as needed. 25 tablet 3  ? ?No current facility-administered medications for this visit.  ? ? ?HISTORY OF PRESENT ILLNESS:  ? ?Oncology History  ?Malignant neoplasm of transverse colon ([place])  ?07/02/2016 Cancer Staging  ? Staging form: Colon and Rectum, AJCC 8th Edition ?- Clinical stage from 07/02/2016: Stage IIIC (cT4a, cN2a, cM0) - Signed by Derwood Kaplan, MD on 01/29/2021 ?Histopathologic type: Adenocarcinoma, NOS ?Stage prefix: Initial diagnosis ?Total positive nodes: 4 ?Total nodes examined: 31 ?Histologic grade (G): G2 ?Histologic grading system: 4 grade system ?Laterality: Right ?Tumor size (mm): 60 ?Lymph-vascular invasion (LVI): LVI not present (absent)/not identified ?Diagnostic confirmation: Positive histology ?Specimen type: Excision ?Staged by: Managing physician ?Tumor deposits (TD): Absent ?Perineural invasion (PNI): Absent ?Microsatellite instability (MSI): Stable ?KRAS mutation: Not assessed ?NRAS mutation: Not assessed ?BRAF Mutation: Not assessed ?Stage used in treatment planning: Yes ?National guidelines used in treatment planning: Yes ?Type of national guideline used in treatment planning: NCCN ?Staging comments: Adjuvant FOLFOX x 12 doses ? ?  ?07/07/2016 Initial Diagnosis  ? Malignant neoplasm of transverse colon ([place]) ?  ?Colon cancer metastasized to  intra-abdominal lymph node ([place])  ?03/31/2018 Initial Diagnosis  ? Colon cancer metastasized to intra-abdominal lymph node ([place]) ?  ?04/02/2018 Cancer Staging  ? Staging form: Colon and Rectum, AJCC 8th Edition ?- Clinical  stage from 04/02/2018: No stage assigned - Signed by Derwood Kaplan, MD on 01/29/2021 ?Histopathologic type: Adenocarcinoma, NOS ?Stage prefix: Recurrence ?Tumor size (mm): 40 ?Diagnostic confirmation: Positive histology ?Specimen type: Excision ?Staged by: Managing physician ?Stage used in treatment planning: Yes ?National guidelines used in treatment planning: Yes ?Type of national guideline used in treatment planning: NCCN ?Staging comments: Recurrence, surgically resected ? ?  ?04/02/2018 Cancer Staging  ? Staging form: Colon and Rectum, AJCC 8th Edition ?- Pathologic stage from 04/02/2018: No stage assigned - Signed by Derwood Kaplan, MD on 01/29/2021 ?Histopathologic type: Adenocarcinoma, NOS ?Stage prefix: Recurrence ?Carcinoembryonic antigen (CEA) (ng/mL): 8.4 ?Microsatellite instability (MSI): Stable ?Prognostic indicators: Extranodal extension, 4 cm node resected ? ?  ?  ? ? ?REVIEW OF SYSTEMS:  ? ?Constitutional: Denies fevers, chills or abnormal weight loss ?Eyes: Denies blurriness of vision ?Ears, nose, mouth, throat, and face: Denies mucositis or sore throat ?Respiratory: Denies cough, dyspnea or wheezes ?Cardiovascular: Denies palpitation, chest discomfort or lower extremity swelling ?Gastrointestinal:  Denies nausea, heartburn or change in bowel habits ?Skin: Denies abnormal skin rashes ?Lymphatics: Denies new lymphadenopathy or easy bruising ?Neurological:Denies numbness, tingling or new weaknesses ?Behavioral/Psych: Mood is stable, no new changes  ?All other systems were reviewed with the patient and are negative. ? ? ?VITALS:  ?Blood pressure (!) 168/81, pulse 69, temperature 98.7 ?F (37.1 ?C), resp. rate 16, height 5' 6.5" (1.689 m), weight 191 lb (86.6 kg), SpO2 96 %.  ?Wt Readings from Last 3 Encounters:  ?08/26/21 191 lb (86.6 kg)  ?04/28/21 184 lb 8 oz (83.7 kg)  ?01/24/21 182 lb 8 oz (82.8 kg)  ?  ?Body mass index is 30.37 kg/m?. ? ?Performance status (ECOG): 1 - Symptomatic but  completely ambulatory ? ?PHYSICAL EXAM:  ? ?GENERAL:alert, no distress and comfortable ?SKIN: skin color, texture, turgor are normal, no rashes or significant lesions ?EYES: normal, Conjunctiva are pink and non-injected, sclera clear ?OROPHARYNX:no exudate, no erythema and lips, buccal mucosa, and tongue normal  ?NECK: supple, thyroid normal size, non-tender, without nodularity ?LYMPH:  no palpable lymphadenopathy in the cervical, axillary or inguinal ?LUNGS: clear to auscultation and percussion with normal breathing effort ?HEART: regular rate & rhythm and no murmurs and no lower extremity edema ?ABDOMEN:abdomen soft, non-tender and normal bowel sounds ?Musculoskeletal:no cyanosis of digits and no clubbing  ?NEURO: alert & oriented x 3 with fluent speech, no focal motor/sensory deficits ? ?LABORATORY DATA:  ?I have reviewed the data as listed ?   ?Component Value Date/Time  ? NA 136 (A) 08/26/2021 0000  ? K 4.5 08/26/2021 0000  ? CL 102 08/26/2021 0000  ? CO2 26 (A) 08/26/2021 0000  ? BUN 12 08/26/2021 0000  ? CREATININE 0.9 08/26/2021 0000  ? CALCIUM 8.8 08/26/2021 0000  ? ALBUMIN 3.7 08/26/2021 0000  ? AST 29 08/26/2021 0000  ? ALT 39 08/26/2021 0000  ? ALKPHOS 52 08/26/2021 0000  ? ? ?No results found for: SPEP, UPEP ? ?Lab Results  ?Component Value Date  ? WBC 6.6 08/26/2021  ? NEUTROABS 3.43 08/26/2021  ? HGB 15.1 08/26/2021  ? HCT 47 08/26/2021  ? PLT 144 (A) 08/26/2021  ? ? ?  Chemistry   ?   ?Component Value Date/Time  ? NA 136 (A) 08/26/2021 0000  ? K 4.5  08/26/2021 0000  ? CL 102 08/26/2021 0000  ? CO2 26 (A) 08/26/2021 0000  ? BUN 12 08/26/2021 0000  ? CREATININE 0.9 08/26/2021 0000  ? GLU 251 08/26/2021 0000  ?    ?Component Value Date/Time  ? CALCIUM 8.8 08/26/2021 0000  ? ALKPHOS 52 08/26/2021 0000  ? AST 29 08/26/2021 0000  ? ALT 39 08/26/2021 0000  ?  ? ? ? ?RADIOGRAPHIC STUDIES: ?I have personally reviewed the radiological images as listed and agreed with the findings in the report. ?No results  found. ?

## 2021-08-26 NOTE — Assessment & Plan Note (Addendum)
History of stage IIIC colon cancer diagnosed in January 2018, treated with surgery and adjuvant FOLFOX chemotherapy. Repeat colonoscopy in 2022 was normal. It is recommended he repeat this in 5 years (2027). CEA from August was 1.7, results are pending for today. He remains without evidence of recurrence.  ?

## 2021-08-27 LAB — CEA: CEA: 1.8 ng/mL (ref 0.0–4.7)

## 2021-08-31 ENCOUNTER — Other Ambulatory Visit: Payer: Self-pay | Admitting: Hematology and Oncology

## 2021-08-31 DIAGNOSIS — G620 Drug-induced polyneuropathy: Secondary | ICD-10-CM

## 2021-09-01 DIAGNOSIS — Z87891 Personal history of nicotine dependence: Secondary | ICD-10-CM | POA: Diagnosis not present

## 2021-09-01 DIAGNOSIS — Z7984 Long term (current) use of oral hypoglycemic drugs: Secondary | ICD-10-CM | POA: Diagnosis not present

## 2021-09-01 DIAGNOSIS — Z471 Aftercare following joint replacement surgery: Secondary | ICD-10-CM | POA: Diagnosis not present

## 2021-09-01 DIAGNOSIS — E119 Type 2 diabetes mellitus without complications: Secondary | ICD-10-CM | POA: Diagnosis not present

## 2021-09-01 DIAGNOSIS — R2689 Other abnormalities of gait and mobility: Secondary | ICD-10-CM | POA: Diagnosis not present

## 2021-09-01 DIAGNOSIS — E7800 Pure hypercholesterolemia, unspecified: Secondary | ICD-10-CM | POA: Diagnosis not present

## 2021-09-01 DIAGNOSIS — Z96651 Presence of right artificial knee joint: Secondary | ICD-10-CM | POA: Diagnosis not present

## 2021-09-01 DIAGNOSIS — Z96642 Presence of left artificial hip joint: Secondary | ICD-10-CM | POA: Diagnosis not present

## 2021-09-01 DIAGNOSIS — G8918 Other acute postprocedural pain: Secondary | ICD-10-CM | POA: Diagnosis not present

## 2021-09-01 DIAGNOSIS — M1712 Unilateral primary osteoarthritis, left knee: Secondary | ICD-10-CM | POA: Diagnosis not present

## 2021-09-01 DIAGNOSIS — M67862 Other specified disorders of synovium, left knee: Secondary | ICD-10-CM | POA: Diagnosis not present

## 2021-09-01 DIAGNOSIS — Z96652 Presence of left artificial knee joint: Secondary | ICD-10-CM | POA: Diagnosis not present

## 2021-09-01 DIAGNOSIS — I10 Essential (primary) hypertension: Secondary | ICD-10-CM | POA: Diagnosis not present

## 2021-09-02 DIAGNOSIS — E119 Type 2 diabetes mellitus without complications: Secondary | ICD-10-CM | POA: Diagnosis not present

## 2021-09-02 DIAGNOSIS — R2689 Other abnormalities of gait and mobility: Secondary | ICD-10-CM | POA: Diagnosis not present

## 2021-09-02 DIAGNOSIS — M1712 Unilateral primary osteoarthritis, left knee: Secondary | ICD-10-CM | POA: Diagnosis not present

## 2021-09-02 DIAGNOSIS — G8918 Other acute postprocedural pain: Secondary | ICD-10-CM | POA: Diagnosis not present

## 2021-09-02 DIAGNOSIS — E7800 Pure hypercholesterolemia, unspecified: Secondary | ICD-10-CM | POA: Diagnosis not present

## 2021-09-02 DIAGNOSIS — I10 Essential (primary) hypertension: Secondary | ICD-10-CM | POA: Diagnosis not present

## 2021-09-03 DIAGNOSIS — M25511 Pain in right shoulder: Secondary | ICD-10-CM | POA: Diagnosis not present

## 2021-09-03 DIAGNOSIS — M5412 Radiculopathy, cervical region: Secondary | ICD-10-CM | POA: Diagnosis not present

## 2021-09-03 DIAGNOSIS — Z7984 Long term (current) use of oral hypoglycemic drugs: Secondary | ICD-10-CM | POA: Diagnosis not present

## 2021-09-03 DIAGNOSIS — I10 Essential (primary) hypertension: Secondary | ICD-10-CM | POA: Diagnosis not present

## 2021-09-03 DIAGNOSIS — Z9049 Acquired absence of other specified parts of digestive tract: Secondary | ICD-10-CM | POA: Diagnosis not present

## 2021-09-03 DIAGNOSIS — Z471 Aftercare following joint replacement surgery: Secondary | ICD-10-CM | POA: Diagnosis not present

## 2021-09-03 DIAGNOSIS — Z87891 Personal history of nicotine dependence: Secondary | ICD-10-CM | POA: Diagnosis not present

## 2021-09-03 DIAGNOSIS — Z85038 Personal history of other malignant neoplasm of large intestine: Secondary | ICD-10-CM | POA: Diagnosis not present

## 2021-09-03 DIAGNOSIS — Z96653 Presence of artificial knee joint, bilateral: Secondary | ICD-10-CM | POA: Diagnosis not present

## 2021-09-03 DIAGNOSIS — Z79891 Long term (current) use of opiate analgesic: Secondary | ICD-10-CM | POA: Diagnosis not present

## 2021-09-03 DIAGNOSIS — K219 Gastro-esophageal reflux disease without esophagitis: Secondary | ICD-10-CM | POA: Diagnosis not present

## 2021-09-03 DIAGNOSIS — M7060 Trochanteric bursitis, unspecified hip: Secondary | ICD-10-CM | POA: Diagnosis not present

## 2021-09-03 DIAGNOSIS — E7800 Pure hypercholesterolemia, unspecified: Secondary | ICD-10-CM | POA: Diagnosis not present

## 2021-09-03 DIAGNOSIS — M5416 Radiculopathy, lumbar region: Secondary | ICD-10-CM | POA: Diagnosis not present

## 2021-09-03 DIAGNOSIS — Z7982 Long term (current) use of aspirin: Secondary | ICD-10-CM | POA: Diagnosis not present

## 2021-09-03 DIAGNOSIS — Z981 Arthrodesis status: Secondary | ICD-10-CM | POA: Diagnosis not present

## 2021-09-03 DIAGNOSIS — E1140 Type 2 diabetes mellitus with diabetic neuropathy, unspecified: Secondary | ICD-10-CM | POA: Diagnosis not present

## 2021-09-04 DIAGNOSIS — I10 Essential (primary) hypertension: Secondary | ICD-10-CM | POA: Diagnosis not present

## 2021-09-04 DIAGNOSIS — K219 Gastro-esophageal reflux disease without esophagitis: Secondary | ICD-10-CM | POA: Diagnosis not present

## 2021-09-04 DIAGNOSIS — M5412 Radiculopathy, cervical region: Secondary | ICD-10-CM | POA: Diagnosis not present

## 2021-09-04 DIAGNOSIS — E1140 Type 2 diabetes mellitus with diabetic neuropathy, unspecified: Secondary | ICD-10-CM | POA: Diagnosis not present

## 2021-09-04 DIAGNOSIS — Z471 Aftercare following joint replacement surgery: Secondary | ICD-10-CM | POA: Diagnosis not present

## 2021-09-04 DIAGNOSIS — M5416 Radiculopathy, lumbar region: Secondary | ICD-10-CM | POA: Diagnosis not present

## 2021-09-05 DIAGNOSIS — Z471 Aftercare following joint replacement surgery: Secondary | ICD-10-CM | POA: Diagnosis not present

## 2021-09-05 DIAGNOSIS — M5412 Radiculopathy, cervical region: Secondary | ICD-10-CM | POA: Diagnosis not present

## 2021-09-05 DIAGNOSIS — K219 Gastro-esophageal reflux disease without esophagitis: Secondary | ICD-10-CM | POA: Diagnosis not present

## 2021-09-05 DIAGNOSIS — M5416 Radiculopathy, lumbar region: Secondary | ICD-10-CM | POA: Diagnosis not present

## 2021-09-05 DIAGNOSIS — E1140 Type 2 diabetes mellitus with diabetic neuropathy, unspecified: Secondary | ICD-10-CM | POA: Diagnosis not present

## 2021-09-05 DIAGNOSIS — I10 Essential (primary) hypertension: Secondary | ICD-10-CM | POA: Diagnosis not present

## 2021-09-06 DIAGNOSIS — E1140 Type 2 diabetes mellitus with diabetic neuropathy, unspecified: Secondary | ICD-10-CM | POA: Diagnosis not present

## 2021-09-06 DIAGNOSIS — M5412 Radiculopathy, cervical region: Secondary | ICD-10-CM | POA: Diagnosis not present

## 2021-09-06 DIAGNOSIS — M5416 Radiculopathy, lumbar region: Secondary | ICD-10-CM | POA: Diagnosis not present

## 2021-09-06 DIAGNOSIS — I10 Essential (primary) hypertension: Secondary | ICD-10-CM | POA: Diagnosis not present

## 2021-09-06 DIAGNOSIS — K219 Gastro-esophageal reflux disease without esophagitis: Secondary | ICD-10-CM | POA: Diagnosis not present

## 2021-09-06 DIAGNOSIS — Z471 Aftercare following joint replacement surgery: Secondary | ICD-10-CM | POA: Diagnosis not present

## 2021-09-08 DIAGNOSIS — I10 Essential (primary) hypertension: Secondary | ICD-10-CM | POA: Diagnosis not present

## 2021-09-08 DIAGNOSIS — M5416 Radiculopathy, lumbar region: Secondary | ICD-10-CM | POA: Diagnosis not present

## 2021-09-08 DIAGNOSIS — M5412 Radiculopathy, cervical region: Secondary | ICD-10-CM | POA: Diagnosis not present

## 2021-09-08 DIAGNOSIS — E1140 Type 2 diabetes mellitus with diabetic neuropathy, unspecified: Secondary | ICD-10-CM | POA: Diagnosis not present

## 2021-09-08 DIAGNOSIS — Z471 Aftercare following joint replacement surgery: Secondary | ICD-10-CM | POA: Diagnosis not present

## 2021-09-08 DIAGNOSIS — K219 Gastro-esophageal reflux disease without esophagitis: Secondary | ICD-10-CM | POA: Diagnosis not present

## 2021-09-09 DIAGNOSIS — K219 Gastro-esophageal reflux disease without esophagitis: Secondary | ICD-10-CM | POA: Diagnosis not present

## 2021-09-09 DIAGNOSIS — I10 Essential (primary) hypertension: Secondary | ICD-10-CM | POA: Diagnosis not present

## 2021-09-09 DIAGNOSIS — Z471 Aftercare following joint replacement surgery: Secondary | ICD-10-CM | POA: Diagnosis not present

## 2021-09-09 DIAGNOSIS — M5416 Radiculopathy, lumbar region: Secondary | ICD-10-CM | POA: Diagnosis not present

## 2021-09-09 DIAGNOSIS — M5412 Radiculopathy, cervical region: Secondary | ICD-10-CM | POA: Diagnosis not present

## 2021-09-09 DIAGNOSIS — E1140 Type 2 diabetes mellitus with diabetic neuropathy, unspecified: Secondary | ICD-10-CM | POA: Diagnosis not present

## 2021-09-11 DIAGNOSIS — I10 Essential (primary) hypertension: Secondary | ICD-10-CM | POA: Diagnosis not present

## 2021-09-11 DIAGNOSIS — M5412 Radiculopathy, cervical region: Secondary | ICD-10-CM | POA: Diagnosis not present

## 2021-09-11 DIAGNOSIS — E1140 Type 2 diabetes mellitus with diabetic neuropathy, unspecified: Secondary | ICD-10-CM | POA: Diagnosis not present

## 2021-09-11 DIAGNOSIS — K219 Gastro-esophageal reflux disease without esophagitis: Secondary | ICD-10-CM | POA: Diagnosis not present

## 2021-09-11 DIAGNOSIS — M5416 Radiculopathy, lumbar region: Secondary | ICD-10-CM | POA: Diagnosis not present

## 2021-09-11 DIAGNOSIS — Z471 Aftercare following joint replacement surgery: Secondary | ICD-10-CM | POA: Diagnosis not present

## 2021-09-12 DIAGNOSIS — Z471 Aftercare following joint replacement surgery: Secondary | ICD-10-CM | POA: Diagnosis not present

## 2021-09-12 DIAGNOSIS — I10 Essential (primary) hypertension: Secondary | ICD-10-CM | POA: Diagnosis not present

## 2021-09-12 DIAGNOSIS — M5412 Radiculopathy, cervical region: Secondary | ICD-10-CM | POA: Diagnosis not present

## 2021-09-12 DIAGNOSIS — E1140 Type 2 diabetes mellitus with diabetic neuropathy, unspecified: Secondary | ICD-10-CM | POA: Diagnosis not present

## 2021-09-12 DIAGNOSIS — M5416 Radiculopathy, lumbar region: Secondary | ICD-10-CM | POA: Diagnosis not present

## 2021-09-12 DIAGNOSIS — K219 Gastro-esophageal reflux disease without esophagitis: Secondary | ICD-10-CM | POA: Diagnosis not present

## 2021-09-16 DIAGNOSIS — Z471 Aftercare following joint replacement surgery: Secondary | ICD-10-CM | POA: Diagnosis not present

## 2021-09-16 DIAGNOSIS — I10 Essential (primary) hypertension: Secondary | ICD-10-CM | POA: Diagnosis not present

## 2021-09-16 DIAGNOSIS — K219 Gastro-esophageal reflux disease without esophagitis: Secondary | ICD-10-CM | POA: Diagnosis not present

## 2021-09-16 DIAGNOSIS — E1140 Type 2 diabetes mellitus with diabetic neuropathy, unspecified: Secondary | ICD-10-CM | POA: Diagnosis not present

## 2021-09-16 DIAGNOSIS — M5416 Radiculopathy, lumbar region: Secondary | ICD-10-CM | POA: Diagnosis not present

## 2021-09-16 DIAGNOSIS — M5412 Radiculopathy, cervical region: Secondary | ICD-10-CM | POA: Diagnosis not present

## 2021-09-18 DIAGNOSIS — M25662 Stiffness of left knee, not elsewhere classified: Secondary | ICD-10-CM | POA: Diagnosis not present

## 2021-09-18 DIAGNOSIS — M25562 Pain in left knee: Secondary | ICD-10-CM | POA: Diagnosis not present

## 2021-09-18 DIAGNOSIS — R2689 Other abnormalities of gait and mobility: Secondary | ICD-10-CM | POA: Diagnosis not present

## 2021-09-18 DIAGNOSIS — M6281 Muscle weakness (generalized): Secondary | ICD-10-CM | POA: Diagnosis not present

## 2021-09-24 DIAGNOSIS — M25562 Pain in left knee: Secondary | ICD-10-CM | POA: Diagnosis not present

## 2021-09-24 DIAGNOSIS — M25662 Stiffness of left knee, not elsewhere classified: Secondary | ICD-10-CM | POA: Diagnosis not present

## 2021-09-24 DIAGNOSIS — M6281 Muscle weakness (generalized): Secondary | ICD-10-CM | POA: Diagnosis not present

## 2021-09-24 DIAGNOSIS — R2689 Other abnormalities of gait and mobility: Secondary | ICD-10-CM | POA: Diagnosis not present

## 2021-09-25 DIAGNOSIS — L299 Pruritus, unspecified: Secondary | ICD-10-CM | POA: Diagnosis not present

## 2021-09-25 DIAGNOSIS — L300 Nummular dermatitis: Secondary | ICD-10-CM | POA: Diagnosis not present

## 2021-09-26 DIAGNOSIS — M6281 Muscle weakness (generalized): Secondary | ICD-10-CM | POA: Diagnosis not present

## 2021-09-26 DIAGNOSIS — M25562 Pain in left knee: Secondary | ICD-10-CM | POA: Diagnosis not present

## 2021-09-26 DIAGNOSIS — R2689 Other abnormalities of gait and mobility: Secondary | ICD-10-CM | POA: Diagnosis not present

## 2021-09-26 DIAGNOSIS — M25662 Stiffness of left knee, not elsewhere classified: Secondary | ICD-10-CM | POA: Diagnosis not present

## 2021-10-28 DIAGNOSIS — M1712 Unilateral primary osteoarthritis, left knee: Secondary | ICD-10-CM | POA: Diagnosis not present

## 2021-10-28 DIAGNOSIS — Z96652 Presence of left artificial knee joint: Secondary | ICD-10-CM | POA: Diagnosis not present

## 2021-11-29 ENCOUNTER — Other Ambulatory Visit: Payer: Self-pay | Admitting: Hematology and Oncology

## 2021-11-29 DIAGNOSIS — G620 Drug-induced polyneuropathy: Secondary | ICD-10-CM

## 2021-12-10 IMAGING — US US EXTREM LOW*R* LIMITED
1 series · 12 of 12 positions shown · non-contrast
Comparison: Radiographs dated 10/09/2019

CLINICAL DATA: Soft tissue mass of right foot.  Right foot pain.

EXAM:
ULTRASOUND RIGHT LOWER EXTREMITY LIMITED
TECHNIQUE: Ultrasound examination of the lower extremity soft tissues was
performed in the area of clinical concern.

[Series 1: us extrem low*right* limited · 0.06mm/px · 12 of 12 slices shown]
[im 1/12]
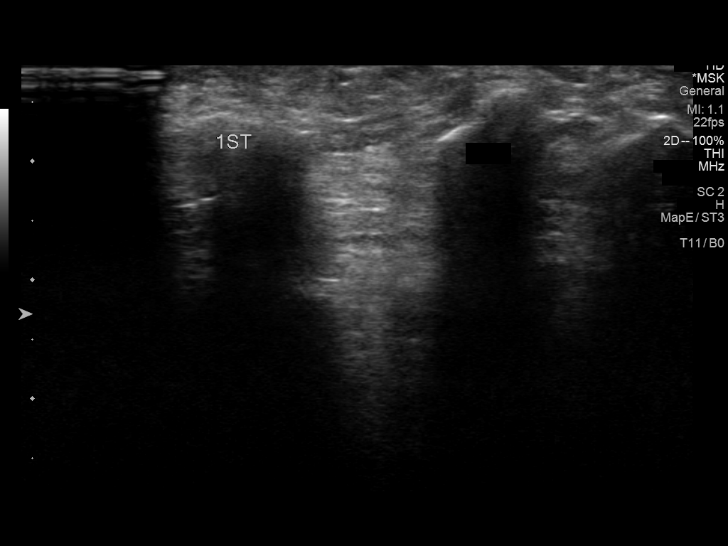
[im 2/12]
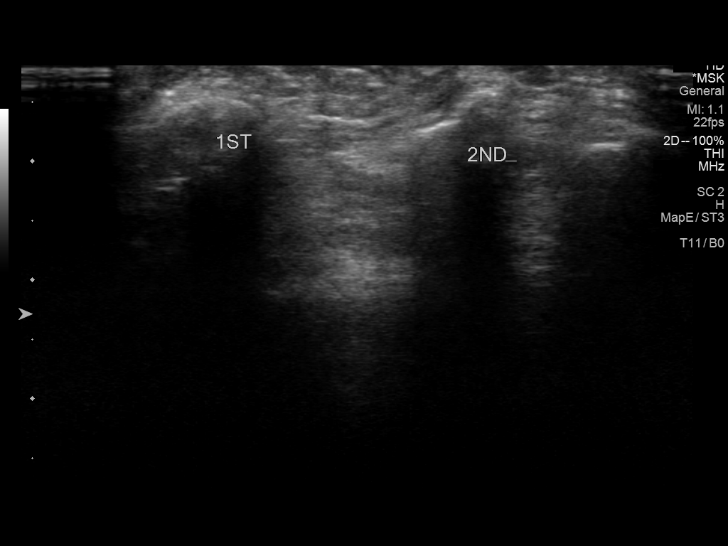
[im 3/12]
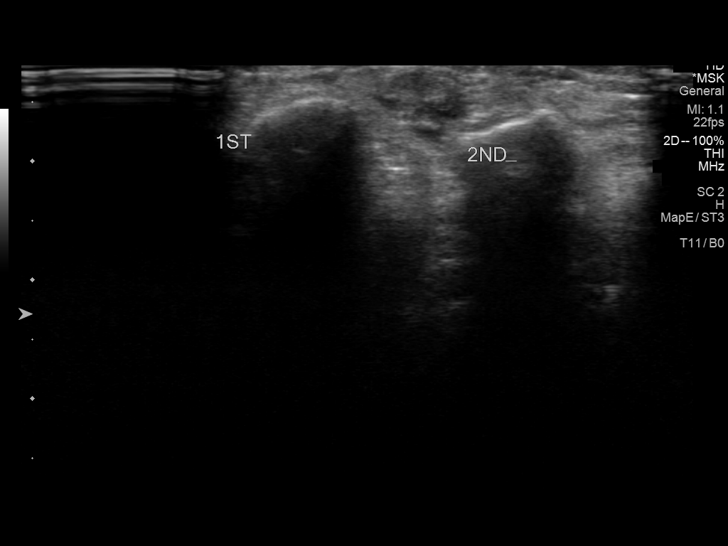
[im 4/12]
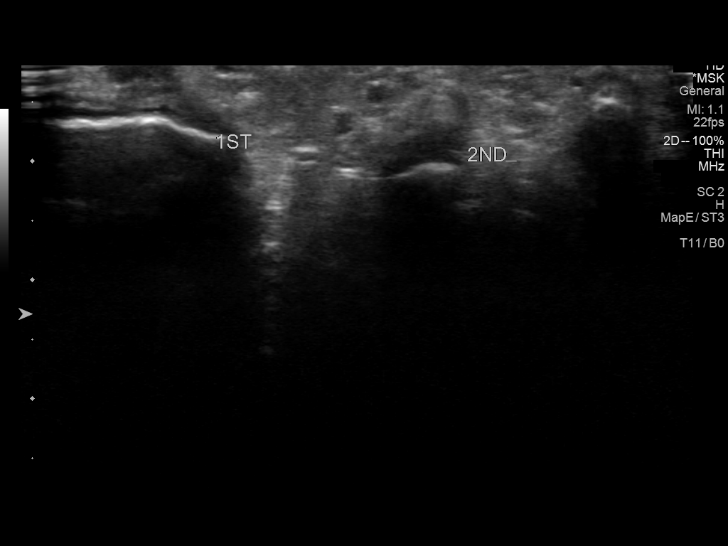
[im 5/12]
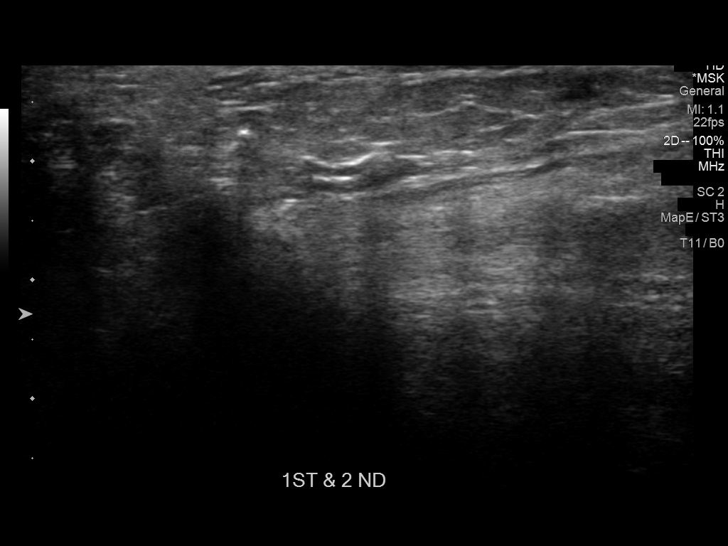
[im 6/12]
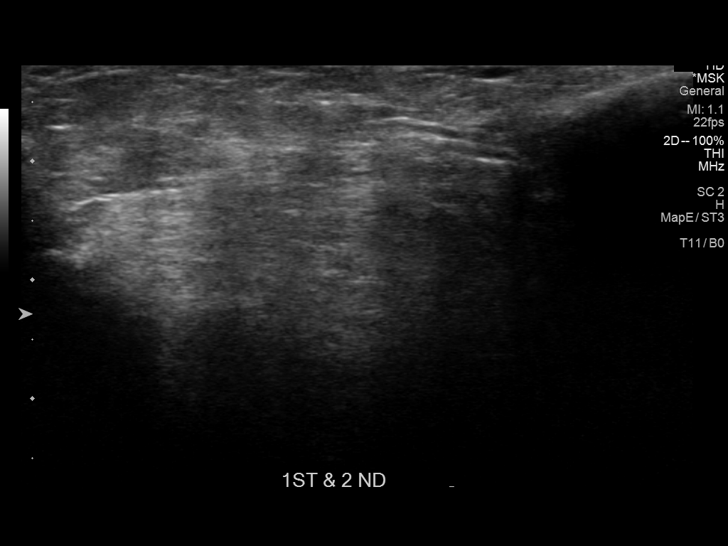
[im 7/12]
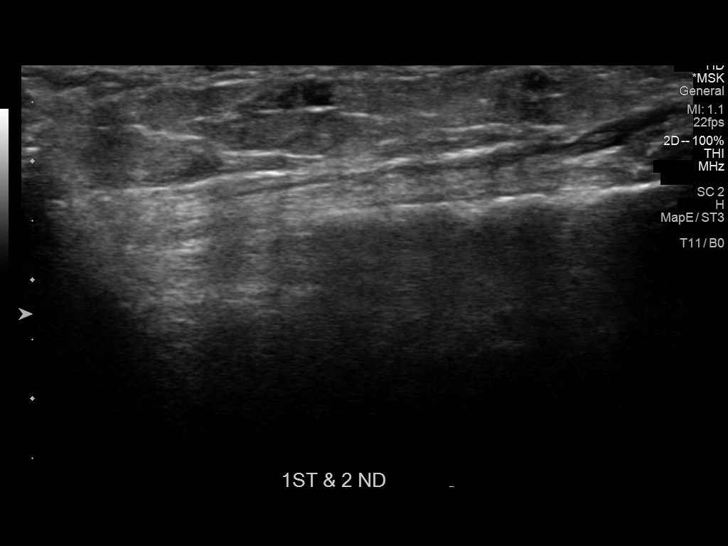
[im 8/12]
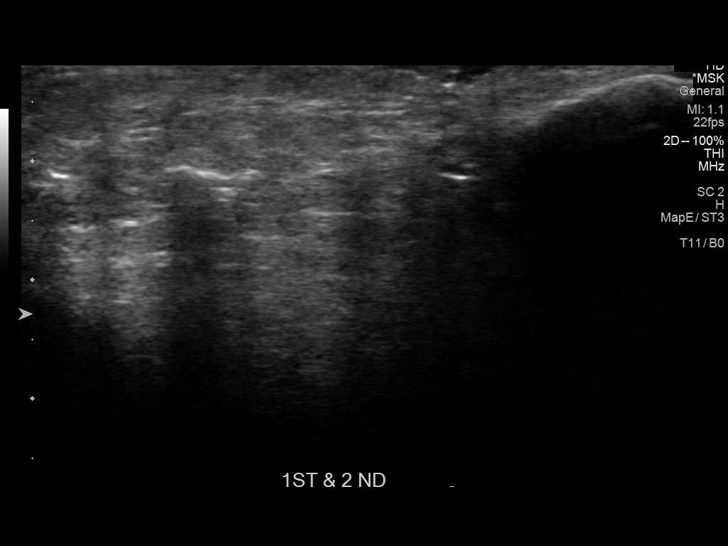
[im 9/12]
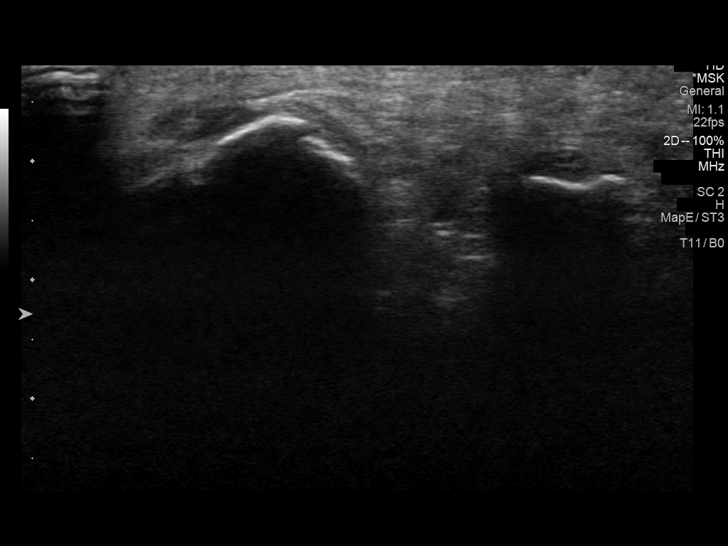
[im 10/12]
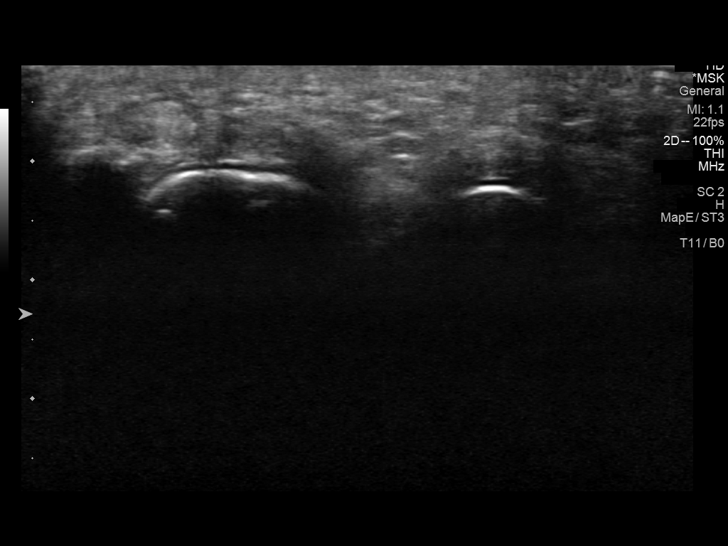
[im 11/12]
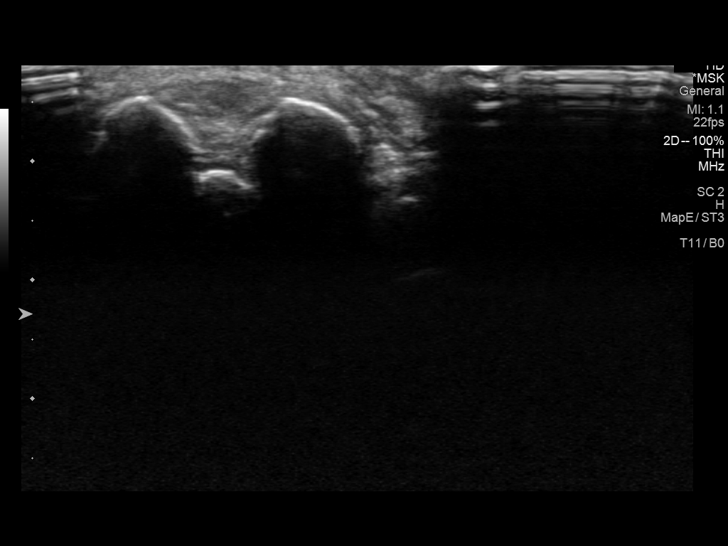
[im 12/12]
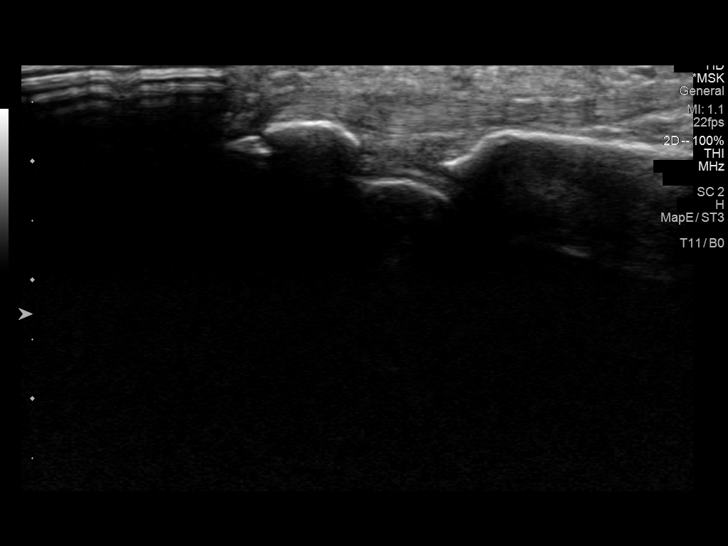

[12 of 12 positions shown; findings below may reference images not displayed]

FINDINGS: Ultrasound was performed over the area of concern between the first
and second toes. There is no evidence of a mass or ganglion cyst or
other abnormality in that region. There are no effusions in the
first or second MTP joints. The adjacent tendons appear normal.
IMPRESSION: Negative ultrasound of the area of concern of the distal right foot.
Specifically, no evidence of a ganglion cyst or soft tissue mass in
the webspace between the first and second metatarsal heads.

## 2021-12-25 ENCOUNTER — Other Ambulatory Visit: Payer: Self-pay | Admitting: Oncology

## 2021-12-25 DIAGNOSIS — Z0000 Encounter for general adult medical examination without abnormal findings: Secondary | ICD-10-CM | POA: Diagnosis not present

## 2021-12-25 DIAGNOSIS — E785 Hyperlipidemia, unspecified: Secondary | ICD-10-CM | POA: Diagnosis not present

## 2021-12-25 DIAGNOSIS — C184 Malignant neoplasm of transverse colon: Secondary | ICD-10-CM

## 2021-12-25 DIAGNOSIS — I700 Atherosclerosis of aorta: Secondary | ICD-10-CM | POA: Diagnosis not present

## 2021-12-25 DIAGNOSIS — E1169 Type 2 diabetes mellitus with other specified complication: Secondary | ICD-10-CM | POA: Diagnosis not present

## 2021-12-25 DIAGNOSIS — I10 Essential (primary) hypertension: Secondary | ICD-10-CM | POA: Diagnosis not present

## 2021-12-26 ENCOUNTER — Other Ambulatory Visit: Payer: Self-pay | Admitting: Oncology

## 2021-12-26 ENCOUNTER — Inpatient Hospital Stay: Payer: Medicare Other | Attending: Oncology | Admitting: Oncology

## 2021-12-26 ENCOUNTER — Inpatient Hospital Stay: Payer: Medicare Other

## 2021-12-26 VITALS — BP 161/75 | HR 62 | Temp 97.5°F | Resp 20 | Ht 68.2 in | Wt 180.9 lb

## 2021-12-26 DIAGNOSIS — C184 Malignant neoplasm of transverse colon: Secondary | ICD-10-CM

## 2021-12-26 DIAGNOSIS — C772 Secondary and unspecified malignant neoplasm of intra-abdominal lymph nodes: Secondary | ICD-10-CM | POA: Diagnosis not present

## 2021-12-26 DIAGNOSIS — E1169 Type 2 diabetes mellitus with other specified complication: Secondary | ICD-10-CM | POA: Diagnosis not present

## 2021-12-26 DIAGNOSIS — Z125 Encounter for screening for malignant neoplasm of prostate: Secondary | ICD-10-CM | POA: Diagnosis not present

## 2021-12-26 DIAGNOSIS — C189 Malignant neoplasm of colon, unspecified: Secondary | ICD-10-CM

## 2021-12-26 DIAGNOSIS — E785 Hyperlipidemia, unspecified: Secondary | ICD-10-CM | POA: Diagnosis not present

## 2021-12-26 DIAGNOSIS — N401 Enlarged prostate with lower urinary tract symptoms: Secondary | ICD-10-CM | POA: Diagnosis not present

## 2021-12-26 LAB — HEPATIC FUNCTION PANEL
ALT: 38 U/L (ref 10–40)
AST: 27 (ref 14–40)
Alkaline Phosphatase: 66 (ref 25–125)
Bilirubin, Total: 0.8

## 2021-12-26 LAB — COMPREHENSIVE METABOLIC PANEL
Albumin: 3.9 (ref 3.5–5.0)
Calcium: 9.1 (ref 8.7–10.7)

## 2021-12-26 LAB — BASIC METABOLIC PANEL
BUN: 9 (ref 4–21)
CO2: 23 — AB (ref 13–22)
Chloride: 106 (ref 99–108)
Creatinine: 0.8 (ref 0.6–1.3)
Glucose: 149
Potassium: 4.4 mEq/L (ref 3.5–5.1)
Sodium: 138 (ref 137–147)

## 2021-12-26 LAB — CBC AND DIFFERENTIAL
HCT: 48 (ref 41–53)
Hemoglobin: 15.4 (ref 13.5–17.5)
Neutrophils Absolute: 3.43
Platelets: 132 10*3/uL — AB (ref 150–400)
WBC: 6.6

## 2021-12-26 LAB — CBC: RBC: 5.54 — AB (ref 3.87–5.11)

## 2021-12-26 NOTE — Progress Notes (Signed)
[facility]  [address] [place],  [place]  [address] [phone]  Clinic Day:  01/26/2022  Referring physician: Emmaline Kluver, *   CHIEF COMPLAINT:  CC: Stage IIIC right colon cancer diagnosed in January 2018 with recurrence in November 2019   Current Treatment: Surveillance   HISTORY OF PRESENT ILLNESS:  Michael Moyer is a 71 y.o. male with a history of stage IIIC (T4a N2a M0) right colon cancer diagnosed in January 2018.  He was treated with a right hemicolectomy.  Pathology revealed a 6 cm, grade 2, adenocarcinoma with invasion of the visceral peritoneum adherent to the omentum.  Four of 31 nodes were positive for metastasis.  MMR was normal and MSI stable.  Due to his personal and family history of malignancy, he was offered testing for hereditary cancer syndrome, but he declined.  He was also found to have iron deficiency and was placed on hemocyte daily, which was later discontinued.  He received adjuvant chemotherapy with FOLFOX for 12 cycles completed in August 2018.  Colonoscopy in January 2019 did not reveal any abnormality.  Three year follow-up was recommended.  In April 2019, there was an increase in the CEA to 7.8.  CT chest, abdomen and pelvis in May did not reveal evidence of recurrence.  The CEA was then down to 5.4 in July.  He was seen in October for routine follow up and had an another increase in his CEA to 8.4.  CT chest, abdomen and pelvis revealed a 3.7 cm soft tissue density along the anterior margin of the stomach below and to the left of the left hepatic lobe.  In retrospect, this was felt to be present previously, but much smaller.  There were foci of fat necrosis along the upper omentum, separate from that area.  PET scan revealed intense hypermetabolic activity with an SUV of 7.7 in the the lobular lesion within the lesser omentum.  There were no other findings suspicious for recurrent/metastatic disease.  He saw Dr.  Noberto Retort and underwent surgical resection of this mass in November.  Pathology revealed metastatic colorectal adenocarcinoma within a lymph node measuring 4 cm in greatest dimension, with a foci of extranodal extension.  He was seen for follow-up in December and as the recurrence was resected and there was no evidence of disease elsewhere, palliative chemotherapy was not recommended.  We have been following his CEA, which has remained normal. Repeat CT imaging in September 2020 did not reveal any evidence of malignancy.  The prior clustered omental nodules in the upper omentum or no longer visible.  There was a 4 mm hypodense lesion in segment 2 of the liver, not readily seen on imaging in October 2019, but probably subtly visible on imaging in May 2019 and felt to be most likely likely benign, but may merit surveillance.  There was a healing new fracture of the right anterior second rib.  Aortic Atherosclerosis and coronary atherosclerosis were seen.  There was descending and sigmoid colon diverticulosis.  There were old small areas of fat necrosis in the left upper quadrant omentum and right upper quadrant mesentery.  There was chronic pars defects at L5 with impingement at L3-4, L4-5, and especially L5-S1.  CT chest, abdomen and pelvis from September 2021 revealed no findings of active or recurrent malignancy.  Colonoscopy in February 2022 did not reveal any abnormalities.   INTERVAL HISTORY:  Michael Moyer is here for routine follow up and states that he has been well.  He still continues to have neuropathy in his fingers and toes. His wife has reported diarrhea but this is chronic. He did see his physician yesterday and there was no concerns from Dr.Street. His  appetite is good, but he has lost 10 pounds since his last visit.  Wt Readings from Last 3 Encounters:  12/26/21 180 lb 14.4 oz (82.1 kg)  08/26/21 191 lb (86.6 kg)  04/28/21 184 lb 8 oz (83.7 kg)   Blood counts and chemistries are unremarkable. He  denies fever, chills or other signs of infection.  He denies nausea, vomiting, bowel issues, or abdominal pain.  He denies sore throat, cough, dyspnea, or chest pain. He denies stomach pain, nausea.     REVIEW OF SYSTEMS:  Review of Systems  Constitutional: Negative.  Negative for appetite change, chills, fatigue, fever and unexpected weight change.  HENT:  Negative.    Eyes: Negative.   Respiratory: Negative.  Negative for chest tightness, cough, hemoptysis, shortness of breath and wheezing.   Cardiovascular: Negative.  Negative for chest pain, leg swelling and palpitations.  Gastrointestinal:  Positive for diarrhea. Negative for abdominal distention, abdominal pain, blood in stool, constipation, nausea and vomiting.  Endocrine: Negative.   Genitourinary: Negative.  Negative for difficulty urinating, dysuria, frequency and hematuria.   Musculoskeletal: Negative.  Negative for arthralgias, back pain, flank pain, gait problem and myalgias.  Skin: Negative.   Neurological:  Negative for dizziness, extremity weakness, gait problem, headaches, light-headedness, numbness, seizures and speech difficulty.  Hematological: Negative.   Psychiatric/Behavioral: Negative.  Negative for depression and sleep disturbance. The patient is not nervous/anxious.   All other systems reviewed and are negative.    VITALS:  Blood pressure (!) 161/75, pulse 62, temperature (!) 97.5 F (36.4 C), temperature source Oral, resp. rate 20, height 5' 8.2" (1.732 m), weight 180 lb 14.4 oz (82.1 kg), SpO2 96 %.  Wt Readings from Last 3 Encounters:  12/26/21 180 lb 14.4 oz (82.1 kg)  08/26/21 191 lb (86.6 kg)  04/28/21 184 lb 8 oz (83.7 kg)    Body mass index is 27.34 kg/m.  Performance status (ECOG): 1 - Symptomatic but completely ambulatory  PHYSICAL EXAM:  Physical Exam Constitutional:      General: He is not in acute distress.    Appearance: Normal appearance. He is normal weight.  HENT:     Head:  Normocephalic and atraumatic.  Eyes:     General: No scleral icterus.    Extraocular Movements: Extraocular movements intact.     Conjunctiva/sclera: Conjunctivae normal.     Pupils: Pupils are equal, round, and reactive to light.  Cardiovascular:     Rate and Rhythm: Normal rate and regular rhythm.     Pulses: Normal pulses.     Heart sounds: Normal heart sounds. No murmur heard.    No friction rub. No gallop.  Pulmonary:     Effort: Pulmonary effort is normal. No respiratory distress.     Breath sounds: Normal breath sounds.  Abdominal:     General: Bowel sounds are normal. There is no distension.     Palpations: Abdomen is soft. There is no hepatomegaly, splenomegaly or mass.     Tenderness: There is no abdominal tenderness.  Musculoskeletal:        General: Normal range of motion.     Cervical back: Normal range of motion and neck supple.     Right lower leg: No edema.     Left lower leg: No edema.  Lymphadenopathy:  Cervical: No cervical adenopathy.  Skin:    General: Skin is warm and dry.  Neurological:     General: No focal deficit present.     Mental Status: He is alert and oriented to person, place, and time. Mental status is at baseline.  Psychiatric:        Mood and Affect: Mood normal.        Behavior: Behavior normal.        Thought Content: Thought content normal.        Judgment: Judgment normal.     LABS:      Latest Ref Rng & Units 12/26/2021   12:00 AM 08/26/2021   12:00 AM 04/28/2021   12:00 AM  CBC  WBC  6.6     6.6     8.7   Hemoglobin 13.5 - 17.5 15.4     15.1     15.9   Hematocrit 41 - 53 48     47     49   Platelets 150 - 400 K/uL 132     144     137      This result is from an external source.      Latest Ref Rng & Units 12/26/2021   12:00 AM 08/26/2021   12:00 AM 04/28/2021   12:00 AM  CMP  BUN 4 - _0 Creatinine 0.6 - 1.3 0.8     0.9     0.8   Sodium 137 - 147 138     136     138   Potassium 3.5 - 5.1 mEq/L 4.4      4.5     4.5   Chloride 99 - 108 106     102     105   CO2 13 - _1 Calcium 8.7 - 10.7 9.1     8.8     9.2   Alkaline Phos 25 - 125 66     52     54   AST 14 - 40 _2 ALT 10 - 40 U/L 38     39     33      This result is from an external source.     Lab Results  Component Value Date   CEA1 2.2 12/26/2021   /  CEA  Date Value Ref Range Status  12/26/2021 2.2 0.0 - 4.7 ng/mL Final    Comment:    (NOTE)                             Nonsmokers          <3.9                             Smokers             <5.6 [employer] Electrochemiluminescence Immunoassay (ECLIA) Values obtained with different assay methods or kits cannot be used interchangeably.  Results cannot be interpreted as absolute evidence of the presence or absence of malignant disease. Performed At: [facility] [place], [place] [id number] Rush Farmer MD TG:[id number]     STUDIES:  No results found.   EXAM: 04/25/2021 CT  CHEST, ABDOMEN, AND PELVIS WITH CONTRAST  TECHNIQUE: Multidetector CT imaging of the chest, abdomen and pelvis was performed following the standard protocol during bolus administration of intravenous contrast.  CONTRAST: 100 cc Isovue  COMPARISON: CT 03/08/2020  FINDINGS: CT CHEST FINDINGS Cardiovascular: No significant vascular findings. Normal heart size. No pericardial effusion. Mediastinum/Nodes: No axillary or supraclavicular adenopathy. No mediastinal or hilar adenopathy. No pericardial fluid. Esophagus normal. Lungs/Pleura: No suspicious pulmonary nodules. Musculoskeletal: No aggressive osseous lesion.  CT ABDOMEN AND PELVIS FINDINGS Hepatobiliary: No focal hepatic lesion. Postcholecystectomy. No biliary dilatation. Pancreas: Pancreas is normal. No ductal dilatation. No pancreatic inflammation. Spleen: Normal spleen Adrenals/urinary tract: Adrenal glands and kidneys are normal. The ureters and bladder  normal. Stomach/Bowel: Stomach, duodenum small-bowel normal. Post RIGHT hemicolectomy. Transverse colon and descending colon normal. Diverticular disease sigmoid colon without acute inflammation. No mesenteric adenopathy. No omental adenopathy. Vascular/Lymphatic: Abdominal aorta is normal caliber. There is no retroperitoneal or periportal lymphadenopathy. No pelvic lymphadenopathy. Reproductive: Prostate unremarkable Other: No free fluid. Musculoskeletal: No aggressive osseous lesion.  IMPRESSION: 1. No evidence of local colorectal carcinoma recurrence in the abdomen. 2. No evidence of metastatic disease in the chest abdomen or pelvis. 3. LEFT colon diverticulosis without evidence diverticulitis. 4. Coronary artery calcification and Aortic Atherosclerosis (ICD10-I70.0).  Allergies:  Allergies  Allergen Reactions   Ciprofloxacin Other (See Comments)    Unknown   Metronidazole Other (See Comments)    Unknown   Prednisone Other (See Comments)    Unknown   Promethazine Other (See Comments)    hysterics    Current Medications: Current Outpatient Medications  Medication Sig Dispense Refill   amoxicillin (AMOXIL) 500 MG capsule Take by mouth.     atorvastatin (LIPITOR) 40 MG tablet Take 1 tablet by mouth daily.     gabapentin (NEURONTIN) 300 MG capsule TAKE 1 CAPSULE(300 MG) BY MOUTH TWICE DAILY 60 capsule 2   lisinopril (ZESTRIL) 10 MG tablet Take 1 tablet by mouth daily.     metFORMIN (GLUCOPHAGE) 500 MG tablet Take 1 tablet by mouth 2 (two) times daily.     naproxen sodium (ALEVE) 220 MG tablet Take by mouth.     nitroGLYCERIN (NITROSTAT) 0.4 MG SL tablet Place 1 tablet (0.4 mg total) under the tongue every 5 (five) minutes as needed. 25 tablet 3   triamcinolone cream (KENALOG) 0.1 % Apply topically 2 (two) times daily.     No current facility-administered medications for this visit.     ASSESSMENT & PLAN:   Assessment:   1. History of stage IIIC colon cancer  diagnosed in January 2018, treated with surgery and adjuvant FOLFOX chemotherapy. Repeat colonoscopy in 2022 was normal. It is recommended he repeat this in 5 years (2027).  2. Recurrent colon cancer in an abdominal lymph node in November 2019, which was surgically resected.  As this was the only site of disease, palliative chemotherapy was not recommended.  He has been on observation for nearly 4 years.  CEA has remained normal and CT imaging in November 2022 did not reveal any evidence of malignancy.    Plan:  We will plan to see him back in 6 months with a CBC, comprehensive metabolic panel and CEA for repeat examination.  We will plan to repeat imaging again if CEA is elevated, otherwise it is not needed every visit.The patient and his wife understand the plans discussed today and are in agreement with them.  They know to contact our office if they develop concerns prior to his next appointment.  I provided 20 minutes of face-to-face time during this this encounter and > 50% was spent counseling as documented under my assessment and plan.    Derwood Kaplan, MD [facility] [address] [place] [address] Dept: [phone] Dept Fax: [phone]    Ninetta Lights as a scribe for Derwood Kaplan, MD.,have documented all relevant documentation on the behalf of Derwood Kaplan, MD,as directed by  Derwood Kaplan, MD while in the presence of Derwood Kaplan, MD.   I have reviewed this report as typed by the medical scribe, and it is complete and accurate.

## 2021-12-27 LAB — CEA: CEA: 2.2 ng/mL (ref 0.0–4.7)

## 2021-12-29 ENCOUNTER — Other Ambulatory Visit: Payer: Self-pay | Admitting: Oncology

## 2022-01-02 ENCOUNTER — Telehealth: Payer: Self-pay

## 2022-01-02 NOTE — Telephone Encounter (Signed)
-----   Message from Derwood Kaplan, MD sent at 01/02/2022  1:47 PM EDT ----- Regarding: call Tell him CEA still normal at 2.2

## 2022-01-02 NOTE — Telephone Encounter (Signed)
Wife returned call and was notified of results.

## 2022-01-26 ENCOUNTER — Encounter: Payer: Self-pay | Admitting: Oncology

## 2022-02-05 DIAGNOSIS — L300 Nummular dermatitis: Secondary | ICD-10-CM | POA: Diagnosis not present

## 2022-02-05 DIAGNOSIS — L299 Pruritus, unspecified: Secondary | ICD-10-CM | POA: Diagnosis not present

## 2022-02-12 DIAGNOSIS — Z6827 Body mass index (BMI) 27.0-27.9, adult: Secondary | ICD-10-CM | POA: Diagnosis not present

## 2022-02-12 DIAGNOSIS — M5136 Other intervertebral disc degeneration, lumbar region: Secondary | ICD-10-CM | POA: Diagnosis not present

## 2022-02-12 DIAGNOSIS — K219 Gastro-esophageal reflux disease without esophagitis: Secondary | ICD-10-CM | POA: Diagnosis not present

## 2022-02-26 ENCOUNTER — Other Ambulatory Visit: Payer: Self-pay | Admitting: Hematology and Oncology

## 2022-02-26 DIAGNOSIS — T451X5A Adverse effect of antineoplastic and immunosuppressive drugs, initial encounter: Secondary | ICD-10-CM

## 2022-02-26 DIAGNOSIS — G620 Drug-induced polyneuropathy: Secondary | ICD-10-CM

## 2022-02-26 MED ORDER — GABAPENTIN 300 MG PO CAPS: 300.0000 mg | ORAL_CAPSULE | Freq: Two times a day (BID) | ORAL | 5 refills | Status: DC

## 2022-03-25 DIAGNOSIS — Z23 Encounter for immunization: Secondary | ICD-10-CM | POA: Diagnosis not present

## 2022-04-03 ENCOUNTER — Telehealth: Payer: Self-pay

## 2022-04-03 NOTE — Telephone Encounter (Signed)
I spoke with Michael Moyer's wife. Osaze feels better today. He was having some left sided pain , near lower rib to lower abdomen yesterday that has subsided. His wife mentioned she thought maybe it was his diverticulitis flaring up.She states she pushed on his stomach and it didn't hurt him at all. Michael Moyer is afebrile. No N/V. No blood in stool. He has daily diarrhea as baseline since surgery. Michael Moyer's PCP couldn't see until November. She will take him to the ER if he begins having N/V or fever. She was appreciative of my call.

## 2022-06-02 DIAGNOSIS — Z85038 Personal history of other malignant neoplasm of large intestine: Secondary | ICD-10-CM | POA: Diagnosis not present

## 2022-06-02 DIAGNOSIS — E1169 Type 2 diabetes mellitus with other specified complication: Secondary | ICD-10-CM | POA: Diagnosis not present

## 2022-06-02 DIAGNOSIS — K5792 Diverticulitis of intestine, part unspecified, without perforation or abscess without bleeding: Secondary | ICD-10-CM | POA: Diagnosis not present

## 2022-06-02 DIAGNOSIS — K2960 Other gastritis without bleeding: Secondary | ICD-10-CM | POA: Diagnosis not present

## 2022-06-25 NOTE — Progress Notes (Signed)
[facility]  [address] [place],  [place]  [address] [phone]  Clinic Day:  06/30/22   Referring physician: Emmaline Kluver, *   CHIEF COMPLAINT:  CC: Stage IIIC right colon cancer diagnosed in January 2018 with recurrence in November 2019   Current Treatment: Surveillance   HISTORY OF PRESENT ILLNESS:  Michael Moyer is a 72 y.o. male with a history of stage IIIC (T4a N2a M0) right colon cancer diagnosed in January 2018.  He was treated with a right hemicolectomy.  Pathology revealed a 6 cm, grade 2, adenocarcinoma with invasion of the visceral peritoneum adherent to the omentum.  Four of 31 nodes were positive for metastasis.  MMR was normal and MSI stable.  Due to his personal and family history of malignancy, he was offered testing for hereditary cancer syndrome, but he declined.  He was also found to have iron deficiency and was placed on hemocyte daily, which was later discontinued.  He received adjuvant chemotherapy with FOLFOX for 12 cycles completed in August 2018.  Colonoscopy in January 2019 did not reveal any abnormality.  Three year follow-up was recommended.  In April 2019, there was an increase in the CEA to 7.8.  CT chest, abdomen and pelvis in May did not reveal evidence of recurrence.  The CEA was then down to 5.4 in July.  He was seen in October for routine follow up and had an another increase in his CEA to 8.4.  CT chest, abdomen and pelvis revealed a 3.7 cm soft tissue density along the anterior margin of the stomach below and to the left of the left hepatic lobe.  In retrospect, this was felt to be present previously, but much smaller.  There were foci of fat necrosis along the upper omentum, separate from that area.  PET scan revealed intense hypermetabolic activity with an SUV of 7.7 in the the lobular lesion within the lesser omentum.  There were no other findings suspicious for recurrent/metastatic disease.  He saw Dr.  Noberto Retort and underwent surgical resection of this mass in November.  Pathology revealed metastatic colorectal adenocarcinoma within a lymph node measuring 4 cm in greatest dimension, with a foci of extranodal extension.  He was seen for follow-up in December and as the recurrence was resected and there was no evidence of disease elsewhere, palliative chemotherapy was not recommended.  We have been following his CEA, which has remained normal. Repeat CT imaging in September 2020 did not reveal any evidence of malignancy.  The prior clustered omental nodules in the upper omentum or no longer visible.  There was a 4 mm hypodense lesion in segment 2 of the liver, not readily seen on imaging in October 2019, but probably subtly visible on imaging in May 2019 and felt to be most likely likely benign, but may merit surveillance.  There was a healing new fracture of the right anterior second rib.  Aortic Atherosclerosis and coronary atherosclerosis were seen.  There was descending and sigmoid colon diverticulosis.  There were old small areas of fat necrosis in the left upper quadrant omentum and right upper quadrant mesentery.  There was chronic pars defects at L5 with impingement at L3-4, L4-5, and especially L5-S1.  CT chest, abdomen and pelvis from September 2021 revealed no findings of active or recurrent malignancy.  Colonoscopy in February 2022 did not reveal any abnormalities.   INTERVAL HISTORY:  Michael Moyer is here for routine follow up and states that he has been  fair. He does note that he has had some lower stomach pain. He states he has watery diarrhea with incontinence. He states he has no control when going to the bathroom. He denies any blood in the stool. I will schedule a CT scan of the abdomen and pelvis, as his last one was in November of 2022, over 1 year ago. His wife had called his PCP about him possibly having diverticulosis and he was prescribed Augmentin, which did not help with the pain, and  caused a reaction. He continues to take Metformin 500 mg, twice a day. We will be adding a lipid panel, thyroid, A1C, and PSA to his labs today at the request of Dr.Street. His  appetite is good, but he has lost 1 pound since his last visit. He denies fever, chills or other signs of infection.  He denies nausea, vomiting, bowel issues, or abdominal pain.  He denies sore throat, cough, dyspnea, or chest pain. He denies stomach pain, nausea.   REVIEW OF SYSTEMS:  Review of Systems  Constitutional: Negative.  Negative for appetite change, chills, fatigue, fever and unexpected weight change.  HENT:  Negative.    Eyes: Negative.   Respiratory: Negative.  Negative for chest tightness, cough, hemoptysis, shortness of breath and wheezing.   Cardiovascular: Negative.  Negative for chest pain, leg swelling and palpitations.  Gastrointestinal:  Positive for abdominal pain and diarrhea. Negative for abdominal distention, blood in stool, constipation, nausea and vomiting.       Pain in the lower quadrant/lower stomach  Endocrine: Negative.   Genitourinary: Negative.  Negative for difficulty urinating, dysuria, frequency and hematuria.   Musculoskeletal: Negative.  Negative for arthralgias, back pain, flank pain, gait problem and myalgias.  Skin: Negative.   Neurological:  Negative for dizziness, extremity weakness, gait problem, headaches, light-headedness, numbness, seizures and speech difficulty.  Hematological: Negative.   Psychiatric/Behavioral: Negative.  Negative for depression and sleep disturbance. The patient is not nervous/anxious.   All other systems reviewed and are negative.    VITALS:  Blood pressure 130/76, pulse (!) 55, temperature 97.9 F (36.6 C), temperature source Oral, resp. rate 18, height 5' 8.2" (1.732 m), weight 179 lb 4.8 oz (81.3 kg), SpO2 97 %.  Wt Readings from Last 3 Encounters:  07/03/22 180 lb 9.6 oz (81.9 kg)  06/30/22 179 lb 4.8 oz (81.3 kg)  12/26/21 180 lb 14.4 oz  (82.1 kg)    Body mass index is 27.1 kg/m.  Performance status (ECOG): 1 - Symptomatic but completely ambulatory  PHYSICAL EXAM:  Physical Exam Constitutional:      General: He is not in acute distress.    Appearance: Normal appearance. He is normal weight.  HENT:     Head: Normocephalic and atraumatic.  Eyes:     General: No scleral icterus.    Extraocular Movements: Extraocular movements intact.     Conjunctiva/sclera: Conjunctivae normal.     Pupils: Pupils are equal, round, and reactive to light.  Cardiovascular:     Rate and Rhythm: Normal rate and regular rhythm.     Pulses: Normal pulses.     Heart sounds: Normal heart sounds. No murmur heard.    No friction rub. No gallop.  Pulmonary:     Effort: Pulmonary effort is normal. No respiratory distress.     Breath sounds: Normal breath sounds.  Abdominal:     General: Bowel sounds are normal. There is no distension.     Palpations: Abdomen is soft. There is no hepatomegaly,  splenomegaly or mass.     Tenderness: There is no abdominal tenderness.     Comments: Mild tenderness in both lower quadrants. Liver and spleen are normal.   Musculoskeletal:        General: Normal range of motion.     Cervical back: Normal range of motion and neck supple.     Right lower leg: No edema.     Left lower leg: No edema.  Lymphadenopathy:     Cervical: No cervical adenopathy.  Skin:    General: Skin is warm and dry.  Neurological:     General: No focal deficit present.     Mental Status: He is alert and oriented to person, place, and time. Mental status is at baseline.  Psychiatric:        Mood and Affect: Mood normal.        Behavior: Behavior normal.        Thought Content: Thought content normal.        Judgment: Judgment normal.     LABS:      Latest Ref Rng & Units 06/30/2022    9:04 AM 12/26/2021   12:00 AM 08/26/2021   12:00 AM  CBC  WBC 4.0 - 10.5 K/uL 7.0  6.6     6.6      Hemoglobin 13.0 - 17.0 g/dL 15.1  15.4      15.1      Hematocrit 39.0 - 52.0 % 48.6  48     47      Platelets 150 - 400 K/uL 165  132     144         This result is from an external source.      Latest Ref Rng & Units 06/30/2022    9:04 AM 12/26/2021   12:00 AM 08/26/2021   12:00 AM  CMP  Glucose 70 - 99 mg/dL 145     BUN 8 - 23 mg/dL '16  9     12      '$ Creatinine 0.61 - 1.24 mg/dL 1.06  0.8     0.9      Sodium 135 - 145 mmol/L 136  138     136      Potassium 3.5 - 5.1 mmol/L 4.3  4.4     4.5      Chloride 98 - 111 mmol/L 104  106     102      CO2 22 - 32 mmol/L '24  23     26      '$ Calcium 8.9 - 10.3 mg/dL 8.7  9.1     8.8      Total Protein 6.5 - 8.1 g/dL 6.6     Total Bilirubin 0.3 - 1.2 mg/dL 0.7     Alkaline Phos 38 - 126 U/L 60  66     52      AST 15 - 41 U/L '24  27     29      '$ ALT 0 - 44 U/L 27  38     39         This result is from an external source.     Lab Results  Component Value Date   CEA1 1.8 06/30/2022   /  CEA  Date Value Ref Range Status  06/30/2022 1.8 0.0 - 4.7 ng/mL Final    Comment:    (NOTE)  Nonsmokers          <3.9                             Smokers             <5.6 [employer] Electrochemiluminescence Immunoassay (ECLIA) Values obtained with different assay methods or kits cannot be used interchangeably.  Results cannot be interpreted as absolute evidence of the presence or absence of malignant disease. Performed At: [facility] [place], [place] [id number] Rush Farmer MD EX:[id number]     STUDIES:  No results found.   EXAM: 04/25/2021 CT CHEST, ABDOMEN, AND PELVIS WITH CONTRAST  TECHNIQUE: Multidetector CT imaging of the chest, abdomen and pelvis was performed following the standard protocol during bolus administration of intravenous contrast.  CONTRAST: 100 cc Isovue  COMPARISON: CT 03/08/2020  FINDINGS: CT CHEST FINDINGS Cardiovascular: No significant vascular findings. Normal heart size. No pericardial  effusion. Mediastinum/Nodes: No axillary or supraclavicular adenopathy. No mediastinal or hilar adenopathy. No pericardial fluid. Esophagus normal. Lungs/Pleura: No suspicious pulmonary nodules. Musculoskeletal: No aggressive osseous lesion.  CT ABDOMEN AND PELVIS FINDINGS Hepatobiliary: No focal hepatic lesion. Postcholecystectomy. No biliary dilatation. Pancreas: Pancreas is normal. No ductal dilatation. No pancreatic inflammation. Spleen: Normal spleen Adrenals/urinary tract: Adrenal glands and kidneys are normal. The ureters and bladder normal. Stomach/Bowel: Stomach, duodenum small-bowel normal. Post RIGHT hemicolectomy. Transverse colon and descending colon normal. Diverticular disease sigmoid colon without acute inflammation. No mesenteric adenopathy. No omental adenopathy. Vascular/Lymphatic: Abdominal aorta is normal caliber. There is no retroperitoneal or periportal lymphadenopathy. No pelvic lymphadenopathy. Reproductive: Prostate unremarkable Other: No free fluid. Musculoskeletal: No aggressive osseous lesion.  IMPRESSION: 1. No evidence of local colorectal carcinoma recurrence in the abdomen. 2. No evidence of metastatic disease in the chest abdomen or pelvis. 3. LEFT colon diverticulosis without evidence diverticulitis. 4. Coronary artery calcification and Aortic Atherosclerosis (ICD10-I70.0).  Allergies:  Allergies  Allergen Reactions   Ciprofloxacin Other (See Comments)    Unknown   Metronidazole Other (See Comments)    Unknown   Prednisone Other (See Comments)    Unknown   Promethazine Other (See Comments)    hysterics    Current Medications: Current Outpatient Medications  Medication Sig Dispense Refill   famotidine (PEPCID) 40 MG tablet Take 40 mg by mouth at bedtime.     omeprazole (PRILOSEC) 40 MG capsule Take 40 mg by mouth every morning.     atorvastatin (LIPITOR) 40 MG tablet Take 1 tablet by mouth daily.     gabapentin (NEURONTIN) 300 MG  capsule Take 1 capsule (300 mg total) by mouth 2 (two) times daily. 60 capsule 5   lisinopril (ZESTRIL) 10 MG tablet Take 1 tablet by mouth daily.     metFORMIN (GLUCOPHAGE) 500 MG tablet Take 1 tablet by mouth 2 (two) times daily.     naproxen sodium (ALEVE) 220 MG tablet Take by mouth.     nitroGLYCERIN (NITROSTAT) 0.4 MG SL tablet Place 1 tablet (0.4 mg total) under the tongue every 5 (five) minutes as needed. 25 tablet 3   triamcinolone cream (KENALOG) 0.1 % Apply topically 2 (two) times daily.     No current facility-administered medications for this visit.     ASSESSMENT & PLAN:   Assessment:   1. History of stage IIIC colon cancer diagnosed in January 2018, treated with surgery and adjuvant FOLFOX chemotherapy. Repeat colonoscopy in 2022 was normal. It is recommended he repeat this  in 5 years (2027).  2. Recurrent colon cancer in an abdominal lymph node in November 2019, which was surgically resected.  As this was the only site of disease, palliative chemotherapy was not recommended.  He has been on observation for nearly 4 years.  CEA has remained normal and CT imaging in November 2022 did not reveal any evidence of malignancy.   3. Abdominal pain in bilateral lower quadrants, associated with water diarrhea and fecal incontinence. His last CT was in November of 2022, so we will schedule CT abdomen and pelvis now.    Plan: I will schedule a CT scan of the abdomen and pelvis. We will plan to see him back in 1 week to discuss the results from the CT scan. The patient and his wife understand the plans discussed today and are in agreement with them.  They know to contact our office if they develop concerns prior to his next appointment.  I provided 20 minutes of face-to-face time during this this encounter and > 50% was spent counseling as documented under my assessment and plan.    Derwood Kaplan, MD [facility] AT [facility] [address] [place] [address] Dept: [phone] Dept Fax: [phone]    Ninetta Lights as a scribe for Derwood Kaplan, MD.,have documented all relevant documentation on the behalf of Derwood Kaplan, MD,as directed by  Derwood Kaplan, MD while in the presence of Derwood Kaplan, MD.   I have reviewed this report as typed by the medical scribe, and it is complete and accurate.

## 2022-06-30 ENCOUNTER — Inpatient Hospital Stay: Payer: Medicare Other | Attending: Oncology | Admitting: Oncology

## 2022-06-30 ENCOUNTER — Inpatient Hospital Stay: Payer: Medicare Other

## 2022-06-30 ENCOUNTER — Encounter: Payer: Self-pay | Admitting: Oncology

## 2022-06-30 ENCOUNTER — Other Ambulatory Visit: Payer: Self-pay | Admitting: Oncology

## 2022-06-30 VITALS — BP 130/76 | HR 55 | Temp 97.9°F | Resp 18 | Ht 68.2 in | Wt 179.3 lb

## 2022-06-30 DIAGNOSIS — R5383 Other fatigue: Secondary | ICD-10-CM

## 2022-06-30 DIAGNOSIS — C184 Malignant neoplasm of transverse colon: Secondary | ICD-10-CM

## 2022-06-30 DIAGNOSIS — R109 Unspecified abdominal pain: Secondary | ICD-10-CM | POA: Insufficient documentation

## 2022-06-30 DIAGNOSIS — C778 Secondary and unspecified malignant neoplasm of lymph nodes of multiple regions: Secondary | ICD-10-CM | POA: Insufficient documentation

## 2022-06-30 DIAGNOSIS — C189 Malignant neoplasm of colon, unspecified: Secondary | ICD-10-CM

## 2022-06-30 DIAGNOSIS — E139 Other specified diabetes mellitus without complications: Secondary | ICD-10-CM

## 2022-06-30 DIAGNOSIS — Z79899 Other long term (current) drug therapy: Secondary | ICD-10-CM | POA: Diagnosis not present

## 2022-06-30 DIAGNOSIS — E611 Iron deficiency: Secondary | ICD-10-CM | POA: Insufficient documentation

## 2022-06-30 DIAGNOSIS — R197 Diarrhea, unspecified: Secondary | ICD-10-CM | POA: Diagnosis not present

## 2022-06-30 DIAGNOSIS — I2510 Atherosclerotic heart disease of native coronary artery without angina pectoris: Secondary | ICD-10-CM | POA: Diagnosis not present

## 2022-06-30 DIAGNOSIS — Z129 Encounter for screening for malignant neoplasm, site unspecified: Secondary | ICD-10-CM

## 2022-06-30 DIAGNOSIS — K5730 Diverticulosis of large intestine without perforation or abscess without bleeding: Secondary | ICD-10-CM | POA: Diagnosis not present

## 2022-06-30 DIAGNOSIS — C182 Malignant neoplasm of ascending colon: Secondary | ICD-10-CM | POA: Diagnosis present

## 2022-06-30 DIAGNOSIS — R159 Full incontinence of feces: Secondary | ICD-10-CM | POA: Diagnosis not present

## 2022-06-30 DIAGNOSIS — C772 Secondary and unspecified malignant neoplasm of intra-abdominal lymph nodes: Secondary | ICD-10-CM | POA: Diagnosis not present

## 2022-06-30 DIAGNOSIS — E1169 Type 2 diabetes mellitus with other specified complication: Secondary | ICD-10-CM

## 2022-06-30 DIAGNOSIS — I700 Atherosclerosis of aorta: Secondary | ICD-10-CM | POA: Diagnosis not present

## 2022-06-30 HISTORY — DX: Other specified diabetes mellitus without complications: E13.9

## 2022-06-30 LAB — PSA: Prostatic Specific Antigen: 1.03 ng/mL (ref 0.00–4.00)

## 2022-06-30 LAB — CMP (CANCER CENTER ONLY)
ALT: 27 U/L (ref 0–44)
AST: 24 U/L (ref 15–41)
Albumin: 3.4 g/dL — ABNORMAL LOW (ref 3.5–5.0)
Alkaline Phosphatase: 60 U/L (ref 38–126)
Anion gap: 8 (ref 5–15)
BUN: 16 mg/dL (ref 8–23)
CO2: 24 mmol/L (ref 22–32)
Calcium: 8.7 mg/dL — ABNORMAL LOW (ref 8.9–10.3)
Chloride: 104 mmol/L (ref 98–111)
Creatinine: 1.06 mg/dL (ref 0.61–1.24)
GFR, Estimated: >60 mL/min (ref >60)
Glucose, Bld: 145 mg/dL — ABNORMAL HIGH (ref 70–99)
Potassium: 4.3 mmol/L (ref 3.5–5.1)
Sodium: 136 mmol/L (ref 135–145)
Total Bilirubin: 0.7 mg/dL (ref 0.3–1.2)
Total Protein: 6.6 g/dL (ref 6.5–8.1)

## 2022-06-30 LAB — CBC WITH DIFFERENTIAL (CANCER CENTER ONLY)
Abs Immature Granulocytes: 0.02 10*3/uL (ref 0.00–0.07)
Basophils Absolute: 0.1 10*3/uL (ref 0.0–0.1)
Basophils Relative: 1 %
Eosinophils Absolute: 0.5 10*3/uL (ref 0.0–0.5)
Eosinophils Relative: 7 %
HCT: 48.6 % (ref 39.0–52.0)
Hemoglobin: 15.1 g/dL (ref 13.0–17.0)
Immature Granulocytes: 0 %
Lymphocytes Relative: 30 %
Lymphs Abs: 2.1 10*3/uL (ref 0.7–4.0)
MCH: 27.8 pg (ref 26.0–34.0)
MCHC: 31.1 g/dL (ref 30.0–36.0)
MCV: 89.5 fL (ref 80.0–100.0)
Monocytes Absolute: 0.8 10*3/uL (ref 0.1–1.0)
Monocytes Relative: 11 %
Neutro Abs: 3.5 10*3/uL (ref 1.7–7.7)
Neutrophils Relative %: 51 %
Platelet Count: 165 10*3/uL (ref 150–400)
RBC: 5.43 MIL/uL (ref 4.22–5.81)
RDW: 13.2 % (ref 11.5–15.5)
WBC Count: 7 10*3/uL (ref 4.0–10.5)
nRBC: 0 % (ref 0.0–0.2)

## 2022-06-30 LAB — LIPID PANEL
Cholesterol: 105 mg/dL (ref 0–200)
HDL: 17 mg/dL — ABNORMAL LOW (ref >40)
LDL Cholesterol: 57 mg/dL (ref 0–99)
Total CHOL/HDL Ratio: 6.2 RATIO
Triglycerides: 157 mg/dL — ABNORMAL HIGH (ref <150)
VLDL: 31 mg/dL (ref 0–40)

## 2022-06-30 LAB — HEMOGLOBIN A1C
Hgb A1c MFr Bld: 7.6 % — ABNORMAL HIGH (ref 4.8–5.6)
Mean Plasma Glucose: 171.42 mg/dL

## 2022-06-30 LAB — TSH: TSH: 1.889 u[IU]/mL (ref 0.350–4.500)

## 2022-07-01 LAB — CEA: CEA: 1.8 ng/mL (ref 0.0–4.7)

## 2022-07-01 LAB — T4: T4, Total: 8 ug/dL (ref 4.5–12.0)

## 2022-07-01 NOTE — Progress Notes (Signed)
[facility]  [address] [place],  [place]  [address] [phone]  Clinic Day:  07/03/22   Referring physician: Emmaline Kluver, *   CHIEF COMPLAINT:  CC: Stage IIIC right colon cancer diagnosed in January 2018 with recurrence in November 2019   Current Treatment: Surveillance   HISTORY OF PRESENT ILLNESS:  Michael Moyer is a 72 y.o. male with a history of stage IIIC (T4a N2a M0) right colon cancer diagnosed in January 2018.  He was treated with a right hemicolectomy.  Pathology revealed a 6 cm, grade 2, adenocarcinoma with invasion of the visceral peritoneum adherent to the omentum.  Four of 31 nodes were positive for metastasis.  MMR was normal and MSI stable.  Due to his personal and family history of malignancy, he was offered testing for hereditary cancer syndrome, but he declined.  He was also found to have iron deficiency and was placed on hemocyte daily, which was later discontinued.  He received adjuvant chemotherapy with FOLFOX for 12 cycles completed in August 2018.  Colonoscopy in January 2019 did not reveal any abnormality.  Three year follow-up was recommended.  In April 2019, there was an increase in the CEA to 7.8.  CT chest, abdomen and pelvis in May did not reveal evidence of recurrence.  The CEA was then down to 5.4 in July.  He was seen in October for routine follow up and had an another increase in his CEA to 8.4.  CT chest, abdomen and pelvis revealed a 3.7 cm soft tissue density along the anterior margin of the stomach below and to the left of the left hepatic lobe.  In retrospect, this was felt to be present previously, but much smaller.  There were foci of fat necrosis along the upper omentum, separate from that area.  PET scan revealed intense hypermetabolic activity with an SUV of 7.7 in the the lobular lesion within the lesser omentum.  There were no other findings suspicious for recurrent/metastatic disease.  He saw Dr.  Noberto Retort and underwent surgical resection of this mass in November.  Pathology revealed metastatic colorectal adenocarcinoma within a lymph node measuring 4 cm in greatest dimension, with a foci of extranodal extension.  He was seen for follow-up in December and as the recurrence was resected and there was no evidence of disease elsewhere, palliative chemotherapy was not recommended.  We have been following his CEA, which has remained normal. Repeat CT imaging in September 2020 did not reveal any evidence of malignancy.  The prior clustered omental nodules in the upper omentum are no longer visible.  There was a 4 mm hypodense lesion in segment 2 of the liver, not readily seen on imaging in October 2019, but probably subtly visible on imaging in May 2019 and felt to be most likely likely benign, but may merit surveillance.  There was a healing new fracture of the right anterior second rib.  Aortic Atherosclerosis and coronary atherosclerosis were seen.  There was descending: And sigmoid colon diverticulosis.  There were old small areas of fat necrosis in the left upper quadrant omentum and right upper quadrant mesentery.  There was chronic pars defects at L5 with impingement at L3-4, L4-5, and especially L5-S1.  CT chest, abdomen and pelvis from September 2021 revealed no findings of active or recurrent malignancy.  Colonoscopy in February 2022 did not reveal any abnormalities.   INTERVAL HISTORY:  Michael Moyer is here for routine follow up of colon cancer which did recur  in an abdominal node and was surgically resected in November 2019. He was seen last week with lower abdominal pain and diarrhea and so we did a thorough evaluation with labs and scans. It had been over a year since he had his last CT scan and his CT abdomen and pelvis scan show no evidence of colon cancer recurrence or metastasis. He states that he has been well and doesn't have any further complaints. His is CEA was normal at 1.8, PSA at 1.0, and  CBC and CMP are norma. I will see him back in 6 months with repeat CBC and CMP.  He denies signs of infection such as sore throat, sinus drainage, cough, or urinary symptoms.  He denies fevers or recurrent chills. He denies pain. He denies nausea, vomiting, chest pain, dyspnea or cough. His weight has been stable. He is accompanied here today with his wife.     REVIEW OF SYSTEMS:  Review of Systems  Constitutional: Negative.  Negative for appetite change, chills, diaphoresis, fatigue, fever and unexpected weight change.  HENT:  Negative.  Negative for hearing loss, lump/mass, mouth sores, nosebleeds, sore throat, tinnitus, trouble swallowing and voice change.   Eyes: Negative.  Negative for eye problems and icterus.  Respiratory: Negative.  Negative for chest tightness, cough, hemoptysis, shortness of breath and wheezing.   Cardiovascular: Negative.  Negative for chest pain, leg swelling and palpitations.  Gastrointestinal: Negative.  Negative for abdominal distention, abdominal pain, blood in stool, constipation, diarrhea, nausea, rectal pain and vomiting.  Endocrine: Negative.   Genitourinary: Negative.  Negative for bladder incontinence, difficulty urinating, dyspareunia, dysuria, frequency, hematuria, nocturia, pelvic pain and penile discharge.   Musculoskeletal: Negative.  Negative for arthralgias, back pain, flank pain, gait problem, myalgias, neck pain and neck stiffness.  Skin: Negative.  Negative for itching, rash and wound.  Neurological:  Negative for dizziness, extremity weakness, gait problem, headaches, light-headedness, numbness, seizures and speech difficulty.  Hematological: Negative.  Negative for adenopathy. Does not bruise/bleed easily.  Psychiatric/Behavioral: Negative.  Negative for confusion, decreased concentration, depression, sleep disturbance and suicidal ideas. The patient is not nervous/anxious.   All other systems reviewed and are negative.    VITALS:  Blood  pressure (!) 149/75, pulse 63, temperature 97.7 F (36.5 C), temperature source Oral, resp. rate 16, height 5' 8.2" (1.732 m), weight 180 lb 9.6 oz (81.9 kg), SpO2 96 %.  Wt Readings from Last 3 Encounters:  07/03/22 180 lb 9.6 oz (81.9 kg)  06/30/22 179 lb 4.8 oz (81.3 kg)  12/26/21 180 lb 14.4 oz (82.1 kg)    Body mass index is 27.3 kg/m.  Performance status (ECOG): 1 - Symptomatic but completely ambulatory  PHYSICAL EXAM:  Physical Exam Vitals and nursing note reviewed. Exam conducted with a chaperone present.  Constitutional:      General: He is not in acute distress.    Appearance: Normal appearance. He is normal weight. He is not ill-appearing, toxic-appearing or diaphoretic.  HENT:     Head: Normocephalic and atraumatic.     Right Ear: Tympanic membrane, ear canal and external ear normal. There is no impacted cerumen.     Left Ear: Tympanic membrane, ear canal and external ear normal. There is no impacted cerumen.     Nose: Nose normal. No congestion or rhinorrhea.     Mouth/Throat:     Mouth: Mucous membranes are moist.     Pharynx: Oropharynx is clear. No oropharyngeal exudate or posterior oropharyngeal erythema.  Eyes:  General: No scleral icterus.       Right eye: No discharge.        Left eye: No discharge.     Extraocular Movements: Extraocular movements intact.     Conjunctiva/sclera: Conjunctivae normal.     Pupils: Pupils are equal, round, and reactive to light.  Neck:     Vascular: No carotid bruit.  Cardiovascular:     Rate and Rhythm: Normal rate and regular rhythm.     Pulses: Normal pulses.     Heart sounds: Normal heart sounds. No murmur heard.    No friction rub. No gallop.  Pulmonary:     Effort: Pulmonary effort is normal. No respiratory distress.     Breath sounds: Normal breath sounds. No stridor. No wheezing, rhonchi or rales.  Chest:     Chest wall: No tenderness.  Abdominal:     General: Bowel sounds are normal. There is no distension.      Palpations: Abdomen is soft. There is no hepatomegaly, splenomegaly or mass.     Tenderness: There is no abdominal tenderness. There is no right CVA tenderness, left CVA tenderness, guarding or rebound.     Hernia: No hernia is present.  Musculoskeletal:        General: No swelling, tenderness, deformity or signs of injury. Normal range of motion.     Cervical back: Normal range of motion and neck supple. No rigidity or tenderness.     Right lower leg: No edema.     Left lower leg: No edema.  Lymphadenopathy:     Cervical: No cervical adenopathy.  Skin:    General: Skin is warm and dry.     Coloration: Skin is not jaundiced or pale.     Findings: No bruising, erythema, lesion or rash.  Neurological:     General: No focal deficit present.     Mental Status: He is alert and oriented to person, place, and time. Mental status is at baseline.     Cranial Nerves: No cranial nerve deficit.     Sensory: No sensory deficit.     Motor: No weakness.     Coordination: Coordination normal.     Gait: Gait normal.     Deep Tendon Reflexes: Reflexes normal.  Psychiatric:        Mood and Affect: Mood normal.        Behavior: Behavior normal.        Thought Content: Thought content normal.        Judgment: Judgment normal.     LABS:      Latest Ref Rng & Units 06/30/2022    9:04 AM 12/26/2021   12:00 AM 08/26/2021   12:00 AM  CBC  WBC 4.0 - 10.5 K/uL 7.0  6.6     6.6      Hemoglobin 13.0 - 17.0 g/dL 15.1  15.4     15.1      Hematocrit 39.0 - 52.0 % 48.6  48     47      Platelets 150 - 400 K/uL 165  132     144         This result is from an external source.      Latest Ref Rng & Units 06/30/2022    9:04 AM 12/26/2021   12:00 AM 08/26/2021   12:00 AM  CMP  Glucose 70 - 99 mg/dL 145     BUN 8 - 23 mg/dL 16  9  12      Creatinine 0.61 - 1.24 mg/dL 1.06  0.8     0.9      Sodium 135 - 145 mmol/L 136  138     136      Potassium 3.5 - 5.1 mmol/L 4.3  4.4     4.5      Chloride 98 - 111  mmol/L 104  106     102      CO2 22 - 32 mmol/L '24  23     26      '$ Calcium 8.9 - 10.3 mg/dL 8.7  9.1     8.8      Total Protein 6.5 - 8.1 g/dL 6.6     Total Bilirubin 0.3 - 1.2 mg/dL 0.7     Alkaline Phos 38 - 126 U/L 60  66     52      AST 15 - 41 U/L '24  27     29      '$ ALT 0 - 44 U/L 27  38     39         This result is from an external source.   Component Ref Range & Units 06/30/2022  Cholesterol 0 - 200 mg/dL 105  Triglycerides <150 mg/dL 157 High   HDL >40 mg/dL 17 Low   Total CHOL/HDL Ratio RATIO 6.2  VLDL 0 - 40 mg/dL 31  LDL Cholesterol 0 - 99 mg/dL 57     Lab Results  Component Value Date   CEA1 1.8 06/30/2022   /  CEA  Date Value Ref Range Status  06/30/2022 1.8 0.0 - 4.7 ng/mL Final    Comment:    (NOTE)                             Nonsmokers          <3.9                             Smokers             <5.6 [employer] Electrochemiluminescence Immunoassay (ECLIA) Values obtained with different assay methods or kits cannot be used interchangeably.  Results cannot be interpreted as absolute evidence of the presence or absence of malignant disease. Performed At: [facility] [place], [place] [id number] Rush Farmer MD WU:[id number]    Component Ref Range & Units 06/30/2022  Prostatic Specific Antigen 0.00 - 4.00 ng/mL 1.03   Component Ref Range & Units 06/30/2022    TSH 0.350 - 4.500 uIU/mL 1.889     Component Ref Range & Units 06/30/2022  T4, Total 4.5 - 12.0 ug/dL 8.0     Component Ref Range & Units 06/30/2022  Hgb A1c MFr Bld 4.8 - 5.6 % 7.6 High      Mean Plasma Glucose mg/dL 171.42    STUDIES:  Exam: 06/30/2022 CT Abdomen and Pelvis with Contrast Impression No evidence of colon cancer recurrence or metastasis. Post RIGHT hemicolectomy. Sigmoid diverticulosis without diverticulitis. Aortic Atherosclerosis (ICD10-170.0).   Allergies:  Allergies  Allergen Reactions   Ciprofloxacin Other (See Comments)     Unknown   Metronidazole Other (See Comments)    Unknown   Prednisone Other (See Comments)    Unknown   Promethazine Other (See Comments)    hysterics    Current Medications: Current Outpatient Medications  Medication Sig Dispense  Refill   atorvastatin (LIPITOR) 40 MG tablet Take 1 tablet by mouth daily.     famotidine (PEPCID) 40 MG tablet Take 40 mg by mouth at bedtime.     gabapentin (NEURONTIN) 300 MG capsule Take 1 capsule (300 mg total) by mouth 2 (two) times daily. 60 capsule 5   lisinopril (ZESTRIL) 10 MG tablet Take 1 tablet by mouth daily.     metFORMIN (GLUCOPHAGE) 500 MG tablet Take 1 tablet by mouth 2 (two) times daily.     naproxen sodium (ALEVE) 220 MG tablet Take by mouth.     nitroGLYCERIN (NITROSTAT) 0.4 MG SL tablet Place 1 tablet (0.4 mg total) under the tongue every 5 (five) minutes as needed. 25 tablet 3   omeprazole (PRILOSEC) 40 MG capsule Take 40 mg by mouth every morning.     triamcinolone cream (KENALOG) 0.1 % Apply topically 2 (two) times daily.     No current facility-administered medications for this visit.     ASSESSMENT & PLAN:   Assessment:   1. History of stage IIIC colon cancer diagnosed in January 2018, treated with surgery and adjuvant FOLFOX chemotherapy. Repeat colonoscopy in 2022 was normal. It is recommended he repeat this in 5 years (2027).  2. Recurrent colon cancer in an abdominal lymph node in November 2019, which was surgically resected.  As this was the only site of disease, palliative chemotherapy was not recommended.  He has been on observation for over 4 years.  CEA has remained normal and CT imaging in November 2022 did not reveal any evidence of malignancy.  Repeat studies in January 2024 remain normal   Plan: His recent pain may have been from diverticulitis which responded to the antibiotics. He is asymptomatic now and all tests are clear including his CEA and PSA. We will plan to see him back in 6 months with a CBC,  comprehensive metabolic panel and CEA for repeat examination.  We will plan to repeat imaging again if CEA is elevated or he has symptoms , we may want to consider a yearly scan. The patient and his wife understand the plans discussed today and are in agreement with them. They know to contact our office if they develop concerns prior to his next appointment.  I provided 20 minutes of face-to-face time during this this encounter and > 50% was spent counseling as documented under my assessment and plan.    Derwood Kaplan, MD [facility] [address] [place] [address] Dept: [phone] Dept Fax: [phone]     Sumner Boast Lassiter,acting as a scribe for Derwood Kaplan, MD.,have documented all relevant documentation on the behalf of Derwood Kaplan, MD,as directed by  Derwood Kaplan, MD while in the presence of Derwood Kaplan, MD.  I have reviewed this report as typed by the medical scribe, and it is complete and accurate.

## 2022-07-02 ENCOUNTER — Ambulatory Visit: Payer: Medicare Other | Admitting: Oncology

## 2022-07-03 ENCOUNTER — Encounter: Payer: Self-pay | Admitting: Oncology

## 2022-07-03 ENCOUNTER — Other Ambulatory Visit: Payer: Self-pay | Admitting: Oncology

## 2022-07-03 ENCOUNTER — Inpatient Hospital Stay (INDEPENDENT_AMBULATORY_CARE_PROVIDER_SITE_OTHER): Payer: Medicare Other | Admitting: Oncology

## 2022-07-03 ENCOUNTER — Telehealth: Payer: Self-pay | Admitting: Oncology

## 2022-07-03 VITALS — BP 149/75 | HR 63 | Temp 97.7°F | Resp 16 | Ht 68.2 in | Wt 180.6 lb

## 2022-07-03 DIAGNOSIS — C184 Malignant neoplasm of transverse colon: Secondary | ICD-10-CM

## 2022-07-03 DIAGNOSIS — C189 Malignant neoplasm of colon, unspecified: Secondary | ICD-10-CM

## 2022-07-03 DIAGNOSIS — C772 Secondary and unspecified malignant neoplasm of intra-abdominal lymph nodes: Secondary | ICD-10-CM

## 2022-07-03 NOTE — Telephone Encounter (Signed)
Patient has been scheduled for follow-up visit per 07/03/22 LOS.  Pt given an appt calendar with date and time.

## 2022-07-14 ENCOUNTER — Telehealth: Payer: Self-pay

## 2022-07-14 NOTE — Telephone Encounter (Signed)
-----  Message from Derwood Kaplan, MD sent at 07/10/2022  7:04 AM EST ----- Regarding: call Tell him all labs look good

## 2022-07-14 NOTE — Telephone Encounter (Signed)
Patient notified of lab results

## 2022-07-24 DIAGNOSIS — M25531 Pain in right wrist: Secondary | ICD-10-CM | POA: Diagnosis not present

## 2022-07-24 DIAGNOSIS — M19031 Primary osteoarthritis, right wrist: Secondary | ICD-10-CM | POA: Diagnosis not present

## 2022-07-30 ENCOUNTER — Encounter: Payer: Self-pay | Admitting: Oncology

## 2022-08-26 ENCOUNTER — Other Ambulatory Visit: Payer: Self-pay | Admitting: Hematology and Oncology

## 2022-08-26 DIAGNOSIS — G620 Drug-induced polyneuropathy: Secondary | ICD-10-CM

## 2022-10-05 DIAGNOSIS — E1169 Type 2 diabetes mellitus with other specified complication: Secondary | ICD-10-CM | POA: Diagnosis not present

## 2022-10-05 DIAGNOSIS — E785 Hyperlipidemia, unspecified: Secondary | ICD-10-CM | POA: Diagnosis not present

## 2022-10-13 DIAGNOSIS — I10 Essential (primary) hypertension: Secondary | ICD-10-CM | POA: Diagnosis not present

## 2022-10-13 DIAGNOSIS — E785 Hyperlipidemia, unspecified: Secondary | ICD-10-CM | POA: Diagnosis not present

## 2022-10-13 DIAGNOSIS — E1169 Type 2 diabetes mellitus with other specified complication: Secondary | ICD-10-CM | POA: Diagnosis not present

## 2022-10-13 DIAGNOSIS — K2960 Other gastritis without bleeding: Secondary | ICD-10-CM | POA: Diagnosis not present

## 2022-12-30 NOTE — Progress Notes (Signed)
[facility]  [address] [place],  [place]  [address] [phone]  Clinic Day:  12/31/2022   Referring physician: Bobbye Morton, *   CHIEF COMPLAINT:  CC: Stage IIIC right colon cancer diagnosed in January 2018 with recurrence in November 2019   Current Treatment: Surveillance   HISTORY OF PRESENT ILLNESS:  Michael Moyer is a 72 y.o. male with a history of stage IIIC (T4a N2a M0) right colon cancer diagnosed in January 2018.  He was treated with a right hemicolectomy.  Pathology revealed a 6 cm, grade 2, adenocarcinoma with invasion of the visceral peritoneum adherent to the omentum.  Four of 31 nodes were positive for metastasis.  MMR was normal and MSI stable.  Due to his personal and family history of malignancy, he was offered testing for hereditary cancer syndrome, but he declined.  He was also found to have iron deficiency and was placed on hemocyte daily, which was later discontinued.  He received adjuvant chemotherapy with FOLFOX for 12 cycles completed in August 2018.  He developed neuropathy which worsened over time and is likely multifactorial chemo and diabetes.  Colonoscopy in January 2019 did not reveal any abnormality.  Three year follow-up was recommended.  In April 2019, there was an increase in the CEA to 7.8.  CT chest, abdomen and pelvis in May did not reveal evidence of recurrence.  The CEA was then down to 5.4 in July.  He was seen in October for routine follow up and had an another increase in his CEA to 8.4.  CT chest, abdomen and pelvis revealed a 3.7 cm soft tissue density along the anterior margin of the stomach below and to the left of the left hepatic lobe.  In retrospect, this was felt to be present previously, but much smaller.  There were foci of fat necrosis along the upper omentum, separate from that area.  PET scan revealed intense hypermetabolic activity with an SUV of 7.7 in the the lobular lesion within the  lesser omentum.  There were no other findings suspicious for recurrent/metastatic disease.  He saw Dr. Georgiana Shore and underwent surgical resection of this mass in November.  Pathology revealed metastatic colorectal adenocarcinoma within a lymph node measuring 4 cm in greatest dimension, with a foci of extranodal extension.  He was seen for follow-up in December and as the recurrence was resected and there was no evidence of disease elsewhere, palliative chemotherapy was not recommended.  We have been following his CEA, which has remained normal. CT imaging in September 2020 did not reveal any evidence of malignancy.  The prior clustered omental nodules in the upper omentum are no longer visible.  There was a 4 mm hypodense lesion in segment 2 of the liver, not readily seen on imaging in October 2019, but probably subtly visible on imaging in May 2019 and felt to be most likely likely benign, but may merit surveillance.  There was a healing new fracture of the right anterior second rib.  Aortic Atherosclerosis and coronary atherosclerosis were seen.  There was descending: And sigmoid colon diverticulosis.  There were old small areas of fat necrosis in the left upper quadrant omentum and right upper quadrant mesentery.  There was chronic pars defects at L5 with impingement at L3-4, L4-5, and especially L5-S1.  CT chest, abdomen and pelvis from September 2021 revealed no findings of active or recurrent malignancy.  Colonoscopy in February 2022 did not reveal any abnormalities.  CT chest, abdomen  and pelvis in November 2022 did not reveal any evidence of recurrent or metastatic disease.  Left colonic diverticulosis without diverticulitis was seen.    At his visit January 16th, he reported lower stomach pain and severe watery diarrhea with stool incontinence.  His wife contacted Dr. Casper Harrison regarding the possibility of diverticulosis and the patient was given Augmentin.  This did not help the pain and caused a reaction.   We therefore ordered a CT abdomen and pelvis.  His wife had called his PCP about him possibly having diverticulosis and he was prescribed Augmentin, which did not help with the pain, and caused a reaction. He continued metformin 500 mg, twice a day.  CBC was normal.  A complete metabolic panel was normal except for albumin 3.4, calcium 8.7 and glucose 145.  Additional labs including a mean serum glucose, lipid panel, thyroid panel, A1C, and PSA were done at Dr. Timothy Lasso request.  The A1c was 7.6 and mean serum glucose 171.42.  Lipid panel was normal except for triglycerides 157 and HDL 17.  PSA and thyroid panel were normal.  CT imaging did not reveal any evidence of recurrent or metastatic disease or other acute abnormality.  Sigmoid diverticulosis without diverticulitis was seen  INTERVAL HISTORY:  [name] is here today for repeat clinical assessment.  He denies recurrent abdominal pain.  He states his diarrhea is back to baseline with metformin.  He denies melena or hematochezia.  His neuropathy is stable.  He denies fatigue.  He denies fevers or chills.  He reports joint pain, which he attributes to arthritis.  His appetite is good. His weight has decreased 1 pounds over last 6 months.  REVIEW OF SYSTEMS:  Review of Systems  Constitutional:  Negative for appetite change, chills, fatigue, fever and unexpected weight change.  HENT:   Negative for lump/mass, mouth sores and sore throat.   Respiratory:  Negative for cough and shortness of breath.   Cardiovascular:  Negative for chest pain and leg swelling.  Gastrointestinal:  Positive for diarrhea. Negative for abdominal pain, constipation, nausea and vomiting.  Genitourinary:  Negative for difficulty urinating, dysuria, frequency and hematuria.   Musculoskeletal:  Positive for arthralgias. Negative for back pain and myalgias.  Skin:  Negative for itching, rash and wound.  Neurological:  Negative for dizziness, extremity weakness, headaches,  light-headedness and numbness.  Hematological:  Negative for adenopathy.  Psychiatric/Behavioral:  Negative for depression and sleep disturbance. The patient is not nervous/anxious.      VITALS:  Blood pressure 135/69, pulse (!) 58, temperature 97.7 F (36.5 C), temperature source Oral, resp. rate 18, height 5' 8.2" (1.732 m), weight 179 lb 9.6 oz (81.5 kg), SpO2 97%.  Wt Readings from Last 3 Encounters:  12/31/22 179 lb 9.6 oz (81.5 kg)  07/03/22 180 lb 9.6 oz (81.9 kg)  06/30/22 179 lb 4.8 oz (81.3 kg)    Body mass index is 27.15 kg/m.  Performance status (ECOG): 1 - Symptomatic but completely ambulatory  PHYSICAL EXAM:  Physical Exam Vitals and nursing note reviewed.  Constitutional:      General: He is not in acute distress.    Appearance: Normal appearance. He is normal weight.  HENT:     Head: Normocephalic and atraumatic.     Mouth/Throat:     Mouth: Mucous membranes are moist.     Pharynx: Oropharynx is clear. No oropharyngeal exudate or posterior oropharyngeal erythema.  Eyes:     General: No scleral icterus.    Extraocular Movements: Extraocular  movements intact.     Conjunctiva/sclera: Conjunctivae normal.     Pupils: Pupils are equal, round, and reactive to light.  Cardiovascular:     Rate and Rhythm: Regular rhythm.     Heart sounds: Normal heart sounds. No murmur heard.    No friction rub. No gallop.  Pulmonary:     Effort: Pulmonary effort is normal.     Breath sounds: Normal breath sounds. No wheezing, rhonchi or rales.  Abdominal:     General: Bowel sounds are normal. There is no distension.     Palpations: Abdomen is soft. There is no hepatomegaly, splenomegaly or mass.     Tenderness: There is no abdominal tenderness.  Musculoskeletal:        General: Normal range of motion.     Cervical back: Normal range of motion and neck supple. No tenderness.     Right lower leg: No edema.     Left lower leg: No edema.  Lymphadenopathy:     Cervical: No  cervical adenopathy.     Upper Body:     Right upper body: No supraclavicular or axillary adenopathy.     Left upper body: No supraclavicular or axillary adenopathy.     Lower Body: No right inguinal adenopathy. No left inguinal adenopathy.  Skin:    General: Skin is warm and dry.     Coloration: Skin is not jaundiced.     Findings: No rash.  Neurological:     Mental Status: He is alert and oriented to person, place, and time.     Cranial Nerves: No cranial nerve deficit.  Psychiatric:        Mood and Affect: Mood normal.        Behavior: Behavior normal.        Thought Content: Thought content normal.     LABS:      Latest Ref Rng & Units 12/31/2022    8:38 AM 06/30/2022    9:04 AM 12/26/2021   12:00 AM  CBC  WBC 4.0 - 10.5 K/uL 7.2  7.0  6.6      Hemoglobin 13.0 - 17.0 g/dL 16.1  09.6  04.5      Hematocrit 39.0 - 52.0 % 49.4  48.6  48      Platelets 150 - 400 K/uL 148  165  132         This result is from an external source.      Latest Ref Rng & Units 12/31/2022    8:38 AM 06/30/2022    9:04 AM 12/26/2021   12:00 AM  CMP  Glucose 70 - 99 mg/dL 409  811    BUN 8 - 23 mg/dL 17  16  9       Creatinine 0.61 - 1.24 mg/dL 9.14  7.82  0.8      Sodium 135 - 145 mmol/L 136  136  138      Potassium 3.5 - 5.1 mmol/L 4.6  4.3  4.4      Chloride 98 - 111 mmol/L 102  104  106      CO2 22 - 32 mmol/L 26  24  23       Calcium 8.9 - 10.3 mg/dL 9.2  8.7  9.1      Total Protein 6.5 - 8.1 g/dL 7.1  6.6    Total Bilirubin 0.3 - 1.2 mg/dL 0.9  0.7    Alkaline Phos 38 - 126 U/L 56  60  66  AST 15 - 41 U/L 22  24  27       ALT 0 - 44 U/L 32  27  38         This result is from an external source.      Lab Results  Component Value Date   CEA1 1.8 06/30/2022   /  CEA  Date Value Ref Range Status  06/30/2022 1.8 0.0 - 4.7 ng/mL Final    Comment:    (NOTE)                             Nonsmokers          <3.9                             Smokers             <5.6 [employer]  Electrochemiluminescence Immunoassay (ECLIA) Values obtained with different assay methods or kits cannot be used interchangeably.  Results cannot be interpreted as absolute evidence of the presence or absence of malignant disease. Performed At: [facility] [address] [place], [place] [id number] Jolene Schimke MD QM:[id number]      STUDIES:    ASSESSMENT & PLAN:   Assessment:   1. History of stage IIIC colon cancer diagnosed in January 2018, treated with surgery and adjuvant FOLFOX chemotherapy. Repeat colonoscopy in 2022 was normal. It is recommended he repeat this in 5 years (2027).  2. Recurrent colon cancer in an abdominal lymph node in November 2019, which was surgically resected.  As this was the only site of disease, palliative chemotherapy was not recommended.  He has been on observation for over nearly 5 years.  CEA has remained normal.  CT abdomen in January 2024 done for abdominal pain and diarrhea did not reveal any evidence of malignancy.  CEA is pending from today.  Plan: We will plan to see him back in 6 months with a CBC, comprehensive metabolic panel and CEA for repeat examination.  We will plan to repeat imaging again if CEA is elevated or he has symptoms. The patient and his wife understand the plans discussed today and are in agreement with them. They know to contact our office if they develop concerns prior to his next appointment.  I provided 20 minutes of face-to-face time during this this encounter and > 50% was spent counseling as documented under my assessment and plan.    Adah Perl, PA-C [facility] [address] [place] [address] Dept: [phone] Dept Fax: [phone]

## 2022-12-31 ENCOUNTER — Encounter: Payer: Self-pay | Admitting: Hematology and Oncology

## 2022-12-31 ENCOUNTER — Inpatient Hospital Stay: Payer: Medicare Other | Attending: Hematology and Oncology | Admitting: Hematology and Oncology

## 2022-12-31 ENCOUNTER — Inpatient Hospital Stay: Payer: Medicare Other

## 2022-12-31 VITALS — BP 135/69 | HR 58 | Temp 97.7°F | Resp 18 | Ht 68.2 in | Wt 179.6 lb

## 2022-12-31 DIAGNOSIS — C182 Malignant neoplasm of ascending colon: Secondary | ICD-10-CM | POA: Diagnosis not present

## 2022-12-31 DIAGNOSIS — C772 Secondary and unspecified malignant neoplasm of intra-abdominal lymph nodes: Secondary | ICD-10-CM

## 2022-12-31 DIAGNOSIS — I700 Atherosclerosis of aorta: Secondary | ICD-10-CM | POA: Insufficient documentation

## 2022-12-31 DIAGNOSIS — C189 Malignant neoplasm of colon, unspecified: Secondary | ICD-10-CM | POA: Diagnosis not present

## 2022-12-31 DIAGNOSIS — E611 Iron deficiency: Secondary | ICD-10-CM | POA: Diagnosis not present

## 2022-12-31 DIAGNOSIS — C184 Malignant neoplasm of transverse colon: Secondary | ICD-10-CM | POA: Diagnosis not present

## 2022-12-31 DIAGNOSIS — R159 Full incontinence of feces: Secondary | ICD-10-CM | POA: Diagnosis not present

## 2022-12-31 DIAGNOSIS — Z79899 Other long term (current) drug therapy: Secondary | ICD-10-CM | POA: Insufficient documentation

## 2022-12-31 DIAGNOSIS — I2510 Atherosclerotic heart disease of native coronary artery without angina pectoris: Secondary | ICD-10-CM | POA: Diagnosis not present

## 2022-12-31 DIAGNOSIS — K5730 Diverticulosis of large intestine without perforation or abscess without bleeding: Secondary | ICD-10-CM | POA: Insufficient documentation

## 2022-12-31 DIAGNOSIS — M2550 Pain in unspecified joint: Secondary | ICD-10-CM | POA: Insufficient documentation

## 2022-12-31 LAB — CMP (CANCER CENTER ONLY)
ALT: 32 U/L (ref 0–44)
AST: 22 U/L (ref 15–41)
Albumin: 3.8 g/dL (ref 3.5–5.0)
Alkaline Phosphatase: 56 U/L (ref 38–126)
Anion gap: 8 (ref 5–15)
BUN: 17 mg/dL (ref 8–23)
CO2: 26 mmol/L (ref 22–32)
Calcium: 9.2 mg/dL (ref 8.9–10.3)
Chloride: 102 mmol/L (ref 98–111)
Creatinine: 1.1 mg/dL (ref 0.61–1.24)
GFR, Estimated: >60 mL/min (ref >60)
Glucose, Bld: 159 mg/dL — ABNORMAL HIGH (ref 70–99)
Potassium: 4.6 mmol/L (ref 3.5–5.1)
Sodium: 136 mmol/L (ref 135–145)
Total Bilirubin: 0.9 mg/dL (ref 0.3–1.2)
Total Protein: 7.1 g/dL (ref 6.5–8.1)

## 2022-12-31 LAB — CBC WITH DIFFERENTIAL (CANCER CENTER ONLY)
Abs Immature Granulocytes: 0.02 10*3/uL (ref 0.00–0.07)
Basophils Absolute: 0.1 10*3/uL (ref 0.0–0.1)
Basophils Relative: 1 %
Eosinophils Absolute: 0.3 10*3/uL (ref 0.0–0.5)
Eosinophils Relative: 4 %
HCT: 49.4 % (ref 39.0–52.0)
Hemoglobin: 15.6 g/dL (ref 13.0–17.0)
Immature Granulocytes: 0 %
Lymphocytes Relative: 37 %
Lymphs Abs: 2.6 10*3/uL (ref 0.7–4.0)
MCH: 28 pg (ref 26.0–34.0)
MCHC: 31.6 g/dL (ref 30.0–36.0)
MCV: 88.7 fL (ref 80.0–100.0)
Monocytes Absolute: 0.7 10*3/uL (ref 0.1–1.0)
Monocytes Relative: 10 %
Neutro Abs: 3.4 10*3/uL (ref 1.7–7.7)
Neutrophils Relative %: 48 %
Platelet Count: 148 10*3/uL — ABNORMAL LOW (ref 150–400)
RBC: 5.57 MIL/uL (ref 4.22–5.81)
RDW: 13.7 % (ref 11.5–15.5)
WBC Count: 7.2 10*3/uL (ref 4.0–10.5)
nRBC: 0 % (ref 0.0–0.2)

## 2023-01-01 ENCOUNTER — Telehealth: Payer: Self-pay | Admitting: Hematology and Oncology

## 2023-01-01 LAB — CEA: CEA: 2.1 ng/mL (ref 0.0–4.7)

## 2023-01-01 NOTE — Telephone Encounter (Signed)
Patient has been scheduled. Aware of appt date and time    Scheduling Message Entered by Belva Crome A on 12/31/2022 at  8:51 AM Priority: Routine <No visit type provided>  Department: [facility]  Provider:  Scheduling Notes:  Labs and f/u with Dr. Shyrl Numbers in 6 months

## 2023-02-02 DIAGNOSIS — Z125 Encounter for screening for malignant neoplasm of prostate: Secondary | ICD-10-CM | POA: Diagnosis not present

## 2023-02-02 DIAGNOSIS — E1169 Type 2 diabetes mellitus with other specified complication: Secondary | ICD-10-CM | POA: Diagnosis not present

## 2023-02-02 DIAGNOSIS — E785 Hyperlipidemia, unspecified: Secondary | ICD-10-CM | POA: Diagnosis not present

## 2023-02-02 LAB — LAB REPORT - SCANNED
EGFR: 60
Free T4: 0.96
TSH: 1.56 (ref 0.41–5.90)

## 2023-02-09 DIAGNOSIS — E785 Hyperlipidemia, unspecified: Secondary | ICD-10-CM | POA: Diagnosis not present

## 2023-02-09 DIAGNOSIS — E1169 Type 2 diabetes mellitus with other specified complication: Secondary | ICD-10-CM | POA: Diagnosis not present

## 2023-02-09 DIAGNOSIS — Z0000 Encounter for general adult medical examination without abnormal findings: Secondary | ICD-10-CM | POA: Diagnosis not present

## 2023-02-09 DIAGNOSIS — G63 Polyneuropathy in diseases classified elsewhere: Secondary | ICD-10-CM | POA: Diagnosis not present

## 2023-02-09 DIAGNOSIS — I10 Essential (primary) hypertension: Secondary | ICD-10-CM | POA: Diagnosis not present

## 2023-02-23 ENCOUNTER — Other Ambulatory Visit: Payer: Self-pay | Admitting: Oncology

## 2023-02-23 DIAGNOSIS — G620 Drug-induced polyneuropathy: Secondary | ICD-10-CM

## 2023-02-24 DIAGNOSIS — J329 Chronic sinusitis, unspecified: Secondary | ICD-10-CM | POA: Diagnosis not present

## 2023-02-24 DIAGNOSIS — J40 Bronchitis, not specified as acute or chronic: Secondary | ICD-10-CM | POA: Diagnosis not present

## 2023-04-06 DIAGNOSIS — Z23 Encounter for immunization: Secondary | ICD-10-CM | POA: Diagnosis not present

## 2023-05-17 DIAGNOSIS — B029 Zoster without complications: Secondary | ICD-10-CM | POA: Diagnosis not present

## 2023-05-17 DIAGNOSIS — E1169 Type 2 diabetes mellitus with other specified complication: Secondary | ICD-10-CM | POA: Diagnosis not present

## 2023-05-17 DIAGNOSIS — E785 Hyperlipidemia, unspecified: Secondary | ICD-10-CM | POA: Diagnosis not present

## 2023-05-17 DIAGNOSIS — G63 Polyneuropathy in diseases classified elsewhere: Secondary | ICD-10-CM | POA: Diagnosis not present

## 2023-05-28 DIAGNOSIS — M7541 Impingement syndrome of right shoulder: Secondary | ICD-10-CM | POA: Diagnosis not present

## 2023-06-18 DIAGNOSIS — M7541 Impingement syndrome of right shoulder: Secondary | ICD-10-CM | POA: Diagnosis not present

## 2023-06-18 DIAGNOSIS — M25512 Pain in left shoulder: Secondary | ICD-10-CM | POA: Diagnosis not present

## 2023-06-18 DIAGNOSIS — M25511 Pain in right shoulder: Secondary | ICD-10-CM | POA: Diagnosis not present

## 2023-07-02 NOTE — Progress Notes (Signed)
[facility]  [address] [place],  [place]  [address] [phone]  Clinic Day: 07/06/23  Referring physician: Bobbye Moyer, *   CHIEF COMPLAINT:  CC: Stage IIIC right colon cancer diagnosed in January 2018 with recurrence in November 2019   Current Treatment: Surveillance  HISTORY OF PRESENT ILLNESS:  Michael Moyer is a 73 y.o. male with a history of stage IIIC (T4a N2a M0) right colon cancer diagnosed in January 2018.  He was treated with a right hemicolectomy.  Pathology revealed a 6 cm, grade 2, adenocarcinoma with invasion of the visceral peritoneum adherent to the omentum.  Four of 31 nodes were positive for metastasis.  MMR was normal and MSI stable.  Due to his personal and family history of malignancy, he was offered testing for hereditary cancer syndrome, but he declined.  He was also found to have iron deficiency and was placed on hemocyte daily, which was later discontinued.  He received adjuvant chemotherapy with FOLFOX for 12 cycles completed in August 2018.  He developed neuropathy which worsened over time and is likely multifactorial chemo and diabetes.  Colonoscopy in January 2019 did not reveal any abnormality.  Three year follow-up was recommended.  In April 2019, there was an increase in the CEA to 7.8.  CT chest, abdomen and pelvis in May did not reveal evidence of recurrence.  The CEA was then down to 5.4 in July.  He was seen in October for routine follow up and had an another increase in his CEA to 8.4.  CT chest, abdomen and pelvis revealed a 3.7 cm soft tissue density along the anterior margin of the stomach below and to the left of the left hepatic lobe.  In retrospect, this was felt to be present previously, but much smaller.  There were foci of fat necrosis along the upper omentum, separate from that area.  PET scan revealed intense hypermetabolic activity with an SUV of 7.7 in the the lobular lesion within the lesser  omentum.  There were no other findings suspicious for recurrent/metastatic disease.  He saw Dr. Georgiana Moyer and underwent surgical resection of this mass in November.  Pathology revealed metastatic colorectal adenocarcinoma within a lymph node measuring 4 cm in greatest dimension, with a foci of extranodal extension.  He was seen for follow-up in December and as the recurrence was resected and there was no evidence of disease elsewhere, palliative chemotherapy was not recommended.  We have been following his CEA, which has remained normal. CT imaging in September 2020 did not reveal any evidence of malignancy.  The prior clustered omental nodules in the upper omentum are no longer visible.  There was a 4 mm hypodense lesion in segment 2 of the liver, not readily seen on imaging in October 2019, but probably subtly visible on imaging in May 2019 and felt to be most likely likely benign, but may merit surveillance.  There was a healing new fracture of the right anterior second rib.  Aortic Atherosclerosis and coronary atherosclerosis were seen.  There was descending: And sigmoid colon diverticulosis.  There were old small areas of fat necrosis in the left upper quadrant omentum and right upper quadrant mesentery.  There was chronic pars defects at L5 with impingement at L3-4, L4-5, and especially L5-S1.  CT chest, abdomen and pelvis from September 2021 revealed no findings of active or recurrent malignancy.  Colonoscopy in February 2022 did not reveal any abnormalities.  CT chest, abdomen and pelvis in  November 2022 did not reveal any evidence of recurrent or metastatic disease.  Left colonic diverticulosis without diverticulitis was seen. His last CT scan was done on 06/30/2022 and showed no evidence of colon cancer recurrence or metastasis.   INTERVAL HISTORY:  Michael Moyer is here today for repeat clinical assessment stage IIIC right colon cancer diagnosed in January 2018 with recurrence in November 2019. Patient  states that he feels well and has no complaints of pain. He continues to have chronic neuropathy in his hands and feet but states this is significantly improved, he continues Lyrica 75mg  once daily. His wife informed me that he has a bowel movement 4-6 times a day but this is regular for him now. His last CT scan was done on 06/30/2022 and showed no evidence of colon cancer recurrence or metastasis. He has a WBC of 8.9, hemoglobin 16.1, and a slightly low platelet count of 141,000. His CMP and CEA is pending today and I will call him with the results. I will see him back in 1 year with CBC, CMP, and CEA. I advised if he has any concerning symptoms or issues then to contact my office sooner.   He denies signs of infection such as sore throat, sinus drainage, cough, or urinary symptoms.  He denies fevers or recurrent chills. He denies pain. He denies nausea, vomiting, chest pain, dyspnea or cough. His appetite is good and his weight has decreased 4 pounds over last 6 months . This patient is accompanied in the office by his wife.   REVIEW OF SYSTEMS:  Review of Systems  Constitutional: Negative.  Negative for appetite change, chills, diaphoresis, fatigue, fever and unexpected weight change.  HENT:  Negative.  Negative for hearing loss, lump/mass, mouth sores, nosebleeds, sore throat, tinnitus, trouble swallowing and voice change.   Eyes: Negative.  Negative for eye problems and icterus.  Respiratory: Negative.  Negative for chest tightness, cough, hemoptysis, shortness of breath and wheezing.   Cardiovascular: Negative.  Negative for chest pain, leg swelling and palpitations.  Gastrointestinal: Negative.  Negative for abdominal distention, abdominal pain, blood in stool, constipation, diarrhea, nausea, rectal pain and vomiting.  Endocrine: Negative.  Negative for hot flashes.  Genitourinary: Negative.  Negative for bladder incontinence, difficulty urinating, dyspareunia, dysuria, frequency, hematuria,  nocturia, pelvic pain and penile discharge.   Musculoskeletal:  Positive for arthralgias. Negative for back pain, flank pain, gait problem, myalgias, neck pain and neck stiffness.  Skin: Negative.  Negative for itching, rash and wound.  Neurological:  Positive for numbness (hands and feet, improved). Negative for dizziness, extremity weakness, gait problem, headaches, light-headedness, seizures and speech difficulty.  Hematological: Negative.  Negative for adenopathy. Does not bruise/bleed easily.  Psychiatric/Behavioral: Negative.  Negative for confusion, decreased concentration, depression, sleep disturbance and suicidal ideas. The patient is not nervous/anxious.      VITALS:  Blood pressure (!) 142/75, pulse 69, temperature 97.6 F (36.4 C), temperature source Oral, resp. rate 16, height 5' 8.2" (1.732 m), weight 175 lb 12.8 oz (79.7 kg), SpO2 97%.  Wt Readings from Last 3 Encounters:  07/06/23 175 lb 12.8 oz (79.7 kg)  12/31/22 179 lb 9.6 oz (81.5 kg)  07/03/22 180 lb 9.6 oz (81.9 kg)    Body mass index is 26.57 kg/m.  Performance status (ECOG): 1 - Symptomatic but completely ambulatory  PHYSICAL EXAM:  Physical Exam Vitals and nursing note reviewed. Exam conducted with a chaperone present.  Constitutional:      General: He is not in acute distress.  Appearance: Normal appearance. He is normal weight. He is not ill-appearing, toxic-appearing or diaphoretic.  HENT:     Head: Normocephalic and atraumatic.     Right Ear: Tympanic membrane, ear canal and external ear normal. There is no impacted cerumen.     Left Ear: Tympanic membrane, ear canal and external ear normal. There is no impacted cerumen.     Nose: Nose normal. No congestion or rhinorrhea.     Mouth/Throat:     Mouth: Mucous membranes are moist.     Pharynx: Oropharynx is clear. No oropharyngeal exudate or posterior oropharyngeal erythema.  Eyes:     General: No scleral icterus.       Right eye: No discharge.         Left eye: No discharge.     Extraocular Movements: Extraocular movements intact.     Conjunctiva/sclera: Conjunctivae normal.     Pupils: Pupils are equal, round, and reactive to light.  Neck:     Vascular: No carotid bruit.  Cardiovascular:     Rate and Rhythm: Normal rate and regular rhythm.     Pulses: Normal pulses.     Heart sounds: Normal heart sounds. No murmur heard.    No friction rub. No gallop.  Pulmonary:     Effort: Pulmonary effort is normal. No respiratory distress.     Breath sounds: Normal breath sounds. No stridor. No wheezing, rhonchi or rales.  Chest:     Chest wall: No tenderness.  Abdominal:     General: Bowel sounds are normal. There is no distension.     Palpations: Abdomen is soft. There is no hepatomegaly, splenomegaly or mass.     Tenderness: There is no abdominal tenderness. There is no right CVA tenderness, left CVA tenderness, guarding or rebound.     Hernia: No hernia is present.  Musculoskeletal:        General: No swelling, tenderness, deformity or signs of injury. Normal range of motion.     Cervical back: Normal range of motion and neck supple. No rigidity or tenderness.     Right lower leg: No edema.     Left lower leg: No edema.  Lymphadenopathy:     Cervical: No cervical adenopathy.     Upper Body:     Right upper body: No supraclavicular or axillary adenopathy.     Left upper body: No supraclavicular or axillary adenopathy.     Lower Body: No right inguinal adenopathy. No left inguinal adenopathy.  Skin:    General: Skin is warm and dry.     Coloration: Skin is not jaundiced or pale.     Findings: No bruising, erythema, lesion or rash.  Neurological:     General: No focal deficit present.     Mental Status: He is alert and oriented to person, place, and time. Mental status is at baseline.     Cranial Nerves: No cranial nerve deficit.     Sensory: No sensory deficit.     Motor: No weakness.     Coordination: Coordination normal.      Gait: Gait normal.     Deep Tendon Reflexes: Reflexes normal.  Psychiatric:        Mood and Affect: Mood normal.        Behavior: Behavior normal.        Thought Content: Thought content normal.        Judgment: Judgment normal.     LABS:      Latest Ref Rng &  Units 07/06/2023    9:15 AM 12/31/2022    8:38 AM 06/30/2022    9:04 AM  CBC  WBC 4.0 - 10.5 K/uL 8.9  7.2  7.0   Hemoglobin 13.0 - 17.0 g/dL 04.5  40.9  81.1   Hematocrit 39.0 - 52.0 % 49.1  49.4  48.6   Platelets 150 - 400 K/uL 141  148  165       Latest Ref Rng & Units 07/06/2023    9:15 AM 12/31/2022    8:38 AM 06/30/2022    9:04 AM  CMP  Glucose 70 - 99 mg/dL 914  782  956   BUN 8 - 23 mg/dL 16  17  16    Creatinine 0.61 - 1.24 mg/dL 2.13  0.86  5.78   Sodium 135 - 145 mmol/L 136  136  136   Potassium 3.5 - 5.1 mmol/L 4.4  4.6  4.3   Chloride 98 - 111 mmol/L 100  102  104   CO2 22 - 32 mmol/L 27  26  24    Calcium 8.9 - 10.3 mg/dL 9.3  9.2  8.7   Total Protein 6.5 - 8.1 g/dL 6.3  7.1  6.6   Total Bilirubin 0.0 - 1.2 mg/dL 0.7  0.9  0.7   Alkaline Phos 38 - 126 U/L 60  56  60   AST 15 - 41 U/L 17  22  24    ALT 0 - 44 U/L 27  32  27    Lab Results  Component Value Date   CEA1 2.1 12/31/2022   CEA 1.99 07/06/2023   /  CEA  Date Value Ref Range Status  12/31/2022 2.1 0.0 - 4.7 ng/mL Final    Comment:    (NOTE)                             Nonsmokers          <3.9                             Smokers             <5.6 [employer] Electrochemiluminescence Immunoassay (ECLIA) Values obtained with different assay methods or kits cannot be used interchangeably.  Results cannot be interpreted as absolute evidence of the presence or absence of malignant disease. Performed At: [facility] [address] [place], [place] [id number] Jolene Schimke MD UX:[id number]    CEA [facility])  Date Value Ref Range Status  07/06/2023 1.99 0.00 - 5.00 ng/mL Final    Comment:    (NOTE) This test was performed  using [employer] Coulter's paramagnetic chemiluminescent immunoassay. Values obtained from different assay methods cannot be used interchangeably. Please note that up to 8% of patients who smoke may see values 5.1-10.0 ng/ml and 1% of patients who smoke may see CEA levels >10.0 ng/ml. Performed at [facility], [address] [place], [place] [address]   No results found for: "PSA1", "PSA" Lab Results  Component Value Date   TSH 1.889 06/30/2022   T4TOTAL 8.0 06/30/2022   STUDIES:  No studies found.  ASSESSMENT & PLAN:  Assessment:   1. History of stage IIIC colon cancer diagnosed in January 2018, treated with surgery and adjuvant FOLFOX chemotherapy. Repeat colonoscopy in 2022 was normal. It is recommended he repeat this in 5 years (2027).  2. Recurrent colon cancer in an abdominal  lymph node in November 2019, which was surgically resected.  As this was the only site of disease, palliative chemotherapy was not recommended.  He has been on observation for over 5 years.  CEA has remained normal.  CT abdomen in January 2024 done for abdominal pain and diarrhea did not reveal any evidence of malignancy.  CEA is pending from today.  3. Peripheral neuropathy due to chemotherapy.   Plan: He continues to have chronic neuropathy in his hands and feet but states this is significantly improved, he continues Lyrica 75mg  once daily. His wife informed me that he has a bowel movement 4-6 times a day but this is regular for him now. His last CT scan was done on 06/30/2022 and showed no evidence of colon cancer recurrence or metastasis. He has a WBC of 8.9, hemoglobin 16.1, and a slightly low platelet count of 141,000. His CMP and CEA is pending today and I will call him with the results. I will see him back in 1 year with CBC, CMP, and CEA. The patient and his wife understand the plans discussed today and are in agreement with them. They know to contact our office if they develop  concerns prior to his next appointment.  I provided 11 minutes of face-to-face time during this this encounter and > 50% was spent counseling as documented under my assessment and plan.   Dellia Beckwith, MD [facility] DEPT OF [facility] [address] [place] [address] Dept: [phone] Dept Fax: [phone]   No orders of the defined types were placed in this encounter.  I,Jasmine M Lassiter,acting as a scribe for Dellia Beckwith, MD.,have documented all relevant documentation on the behalf of Dellia Beckwith, MD,as directed by  Dellia Beckwith, MD while in the presence of Dellia Beckwith, MD.

## 2023-07-05 ENCOUNTER — Other Ambulatory Visit: Payer: Self-pay | Admitting: Oncology

## 2023-07-05 DIAGNOSIS — C184 Malignant neoplasm of transverse colon: Secondary | ICD-10-CM

## 2023-07-06 ENCOUNTER — Inpatient Hospital Stay: Payer: Medicare Other | Attending: Oncology | Admitting: Oncology

## 2023-07-06 ENCOUNTER — Inpatient Hospital Stay: Payer: Self-pay

## 2023-07-06 ENCOUNTER — Other Ambulatory Visit: Payer: Self-pay | Admitting: Oncology

## 2023-07-06 ENCOUNTER — Telehealth: Payer: Self-pay | Admitting: Oncology

## 2023-07-06 ENCOUNTER — Encounter: Payer: Self-pay | Admitting: Oncology

## 2023-07-06 VITALS — BP 142/75 | HR 69 | Temp 97.6°F | Resp 16 | Ht 68.2 in | Wt 175.8 lb

## 2023-07-06 DIAGNOSIS — C182 Malignant neoplasm of ascending colon: Secondary | ICD-10-CM | POA: Insufficient documentation

## 2023-07-06 DIAGNOSIS — I700 Atherosclerosis of aorta: Secondary | ICD-10-CM | POA: Insufficient documentation

## 2023-07-06 DIAGNOSIS — C772 Secondary and unspecified malignant neoplasm of intra-abdominal lymph nodes: Secondary | ICD-10-CM

## 2023-07-06 DIAGNOSIS — I2510 Atherosclerotic heart disease of native coronary artery without angina pectoris: Secondary | ICD-10-CM | POA: Insufficient documentation

## 2023-07-06 DIAGNOSIS — C184 Malignant neoplasm of transverse colon: Secondary | ICD-10-CM

## 2023-07-06 DIAGNOSIS — E119 Type 2 diabetes mellitus without complications: Secondary | ICD-10-CM | POA: Insufficient documentation

## 2023-07-06 DIAGNOSIS — E611 Iron deficiency: Secondary | ICD-10-CM | POA: Diagnosis not present

## 2023-07-06 DIAGNOSIS — Z79899 Other long term (current) drug therapy: Secondary | ICD-10-CM | POA: Diagnosis not present

## 2023-07-06 DIAGNOSIS — K5730 Diverticulosis of large intestine without perforation or abscess without bleeding: Secondary | ICD-10-CM | POA: Diagnosis not present

## 2023-07-06 DIAGNOSIS — G620 Drug-induced polyneuropathy: Secondary | ICD-10-CM | POA: Diagnosis not present

## 2023-07-06 DIAGNOSIS — C189 Malignant neoplasm of colon, unspecified: Secondary | ICD-10-CM | POA: Diagnosis not present

## 2023-07-06 LAB — CBC WITH DIFFERENTIAL (CANCER CENTER ONLY)
Abs Immature Granulocytes: 0.04 10*3/uL (ref 0.00–0.07)
Basophils Absolute: 0.1 10*3/uL (ref 0.0–0.1)
Basophils Relative: 1 %
Eosinophils Absolute: 0.1 10*3/uL (ref 0.0–0.5)
Eosinophils Relative: 2 %
HCT: 49.1 % (ref 39.0–52.0)
Hemoglobin: 16.1 g/dL (ref 13.0–17.0)
Immature Granulocytes: 1 %
Lymphocytes Relative: 33 %
Lymphs Abs: 2.9 10*3/uL (ref 0.7–4.0)
MCH: 28.6 pg (ref 26.0–34.0)
MCHC: 32.8 g/dL (ref 30.0–36.0)
MCV: 87.2 fL (ref 80.0–100.0)
Monocytes Absolute: 0.8 10*3/uL (ref 0.1–1.0)
Monocytes Relative: 9 %
Neutro Abs: 4.9 10*3/uL (ref 1.7–7.7)
Neutrophils Relative %: 54 %
Platelet Count: 141 10*3/uL — ABNORMAL LOW (ref 150–400)
RBC: 5.63 MIL/uL (ref 4.22–5.81)
RDW: 14.4 % (ref 11.5–15.5)
WBC Count: 8.9 10*3/uL (ref 4.0–10.5)
nRBC: 0 % (ref 0.0–0.2)
nRBC: 0 /100{WBCs}

## 2023-07-06 LAB — CMP (CANCER CENTER ONLY)
ALT: 27 U/L (ref 0–44)
AST: 17 U/L (ref 15–41)
Albumin: 3.8 g/dL (ref 3.5–5.0)
Alkaline Phosphatase: 60 U/L (ref 38–126)
Anion gap: 9 (ref 5–15)
BUN: 16 mg/dL (ref 8–23)
CO2: 27 mmol/L (ref 22–32)
Calcium: 9.3 mg/dL (ref 8.9–10.3)
Chloride: 100 mmol/L (ref 98–111)
Creatinine: 1.04 mg/dL (ref 0.61–1.24)
GFR, Estimated: >60 mL/min (ref >60)
Glucose, Bld: 180 mg/dL — ABNORMAL HIGH (ref 70–99)
Potassium: 4.4 mmol/L (ref 3.5–5.1)
Sodium: 136 mmol/L (ref 135–145)
Total Bilirubin: 0.7 mg/dL (ref 0.0–1.2)
Total Protein: 6.3 g/dL — ABNORMAL LOW (ref 6.5–8.1)

## 2023-07-06 LAB — CEA (ACCESS): CEA (CHCC): 1.99 ng/mL (ref 0.00–5.00)

## 2023-07-06 NOTE — Telephone Encounter (Signed)
07/06/23 Next appt scheduled and confirmed with patient

## 2023-07-08 ENCOUNTER — Other Ambulatory Visit: Payer: Self-pay

## 2023-07-09 ENCOUNTER — Other Ambulatory Visit: Payer: Self-pay

## 2023-07-09 ENCOUNTER — Ambulatory Visit: Payer: Self-pay | Admitting: Cardiology

## 2023-07-09 DIAGNOSIS — E785 Hyperlipidemia, unspecified: Secondary | ICD-10-CM | POA: Insufficient documentation

## 2023-07-09 DIAGNOSIS — G63 Polyneuropathy in diseases classified elsewhere: Secondary | ICD-10-CM | POA: Insufficient documentation

## 2023-07-09 DIAGNOSIS — Z6828 Body mass index (BMI) 28.0-28.9, adult: Secondary | ICD-10-CM | POA: Insufficient documentation

## 2023-07-09 DIAGNOSIS — B029 Zoster without complications: Secondary | ICD-10-CM | POA: Insufficient documentation

## 2023-07-09 DIAGNOSIS — E663 Overweight: Secondary | ICD-10-CM | POA: Insufficient documentation

## 2023-07-11 ENCOUNTER — Encounter (HOSPITAL_BASED_OUTPATIENT_CLINIC_OR_DEPARTMENT_OTHER): Payer: Self-pay | Admitting: Emergency Medicine

## 2023-07-11 ENCOUNTER — Ambulatory Visit (HOSPITAL_BASED_OUTPATIENT_CLINIC_OR_DEPARTMENT_OTHER)
Admission: EM | Admit: 2023-07-11 | Discharge: 2023-07-11 | Disposition: A | Discharge status: Home / Self Care | Payer: Medicare Other | Attending: Family Medicine | Admitting: Family Medicine

## 2023-07-11 DIAGNOSIS — J3489 Other specified disorders of nose and nasal sinuses: Secondary | ICD-10-CM

## 2023-07-11 DIAGNOSIS — H6123 Impacted cerumen, bilateral: Secondary | ICD-10-CM | POA: Diagnosis not present

## 2023-07-11 DIAGNOSIS — J0140 Acute pansinusitis, unspecified: Secondary | ICD-10-CM

## 2023-07-11 MED ORDER — CEFDINIR 300 MG PO CAPS: 300.0000 mg | ORAL_CAPSULE | Freq: Two times a day (BID) | ORAL | 0 refills | Status: AC

## 2023-07-11 NOTE — ED Triage Notes (Signed)
Pt c/o sinus congestion, runny nose, sore throat, coughing x 3-4 days ago

## 2023-07-11 NOTE — Discharge Instructions (Addendum)
Sinusitis: Encouraged sinus rinses.  Has a prednisone allergy so discouraged use of OTC steroid nasal sprays.  Encouraged Mucinex OTC, as directed on the package, to help liquefy mucus.  Cefdinir, 300 mg, twice daily for 7 days for sinusitis.  Get plenty of fluids and rest.  Return here or to PCP for ear lavage.  Follow-up if symptoms do not improve, worsen or new symptoms occur.

## 2023-07-11 NOTE — ED Provider Notes (Signed)
Michael Moyer CARE    CSN: [id number] Arrival date & time: 07/11/23  0902      History   Chief Complaint No chief complaint on file.   HPI Michael Moyer is a 73 y.o. male.   Here with complaint of head congestion and cough with significant sinus pressure and pain.  He is getting a discolored mucus out when he blows his nose.  Denies fever, nausea, vomiting, diarrhea, constipation, chest congestion.  Symptoms for 4 to 5 days.  He is starting to have some facial pain.     Past Medical History:  Diagnosis Date   Admission for fitting and adjustment of vascular catheter 08/17/2017   BMI 28.0-28.9,adult    Colon cancer metastasized to intra-abdominal lymph node (HCC) 03/31/2018   Found by rising CEA, resected     Colonic mass 06/23/2016   Diabetes 1.5, managed as type 2 (HCC) 06/30/2022   Dyslipidemia    Iron deficiency anemia due to chronic blood loss 07/01/2016   LLQ pain 04/06/2016   Malignant neoplasm of transverse colon (HCC) 07/07/2016   Omental mass 04/15/2018   Overweight    Personal history of colon cancer 06/24/2020   Polyneuropathy in other diseases classified elsewhere [facility])    Postoperative examination 07/28/2016   Shingles    Type 2 diabetes mellitus with hyperlipidemia [facility])     Patient Active Problem List   Diagnosis Date Noted   BMI 28.0-28.9,adult    Dyslipidemia    Overweight    Polyneuropathy in other diseases classified elsewhere (HCC)    Shingles    Type 2 diabetes mellitus with hyperlipidemia (HCC)    Diabetes 1.5, managed as type 2 (HCC) 06/30/2022    Class: Chronic   Personal history of colon cancer 06/24/2020   Screening examination for other arthropod-borne viral diseases 06/24/2020   Omental mass 04/15/2018   Colon cancer metastasized to intra-abdominal lymph node (HCC) 03/31/2018    Class: Diagnosis of   Admission for fitting and adjustment of vascular catheter 08/17/2017   Postoperative examination 07/28/2016   Malignant  neoplasm of transverse colon (HCC) 07/07/2016   Iron deficiency anemia due to chronic blood loss 07/01/2016    Class: History of   Colonic mass 06/23/2016   LLQ pain 04/06/2016    Past Surgical History:  Procedure Laterality Date   BACK SURGERY     CHOLECYSTECTOMY     HERNIA REPAIR         Home Medications    Prior to Admission medications   Medication Sig Start Date End Date Taking? Authorizing Provider  atorvastatin (LIPITOR) 40 MG tablet Take 1 tablet by mouth daily. 01/04/16  Yes [provider]  cefdinir (OMNICEF) 300 MG capsule Take 1 capsule (300 mg total) by mouth 2 (two) times daily after a meal for 7 days. 07/11/23 07/18/23 Yes Prescilla Sours, FNP  famotidine (PEPCID) 40 MG tablet Take 40 mg by mouth daily.   Yes [provider]  lisinopril (ZESTRIL) 10 MG tablet Take 1 tablet by mouth daily. 11/14/18  Yes [provider]  melatonin 1 MG TABS tablet Take 1 mg by mouth at bedtime.   Yes [provider]  metFORMIN (GLUCOPHAGE-XR) 500 MG 24 hr tablet Take 500 mg by mouth 2 (two) times daily. 10/14/22  Yes [provider]  omeprazole (PRILOSEC) 40 MG capsule Take 40 mg by mouth daily. 06/02/22  Yes [provider]  pregabalin (LYRICA) 75 MG capsule Take 75 mg by mouth 2 (two) times daily  as needed (nerve pain).   Yes [provider]  valACYclovir (VALTREX) 1000 MG tablet Take 1,000 mg by mouth 2 (two) times daily.   Yes [provider]  acetaminophen (TYLENOL) 500 MG tablet Take 1,000 mg by mouth as needed for mild pain (pain score 1-3) or moderate pain (pain score 4-6).    [provider]  gabapentin (NEURONTIN) 300 MG capsule Take 300 mg by mouth 3 (three) times daily.    [provider]    Family History Family History  Problem Relation Age of Onset   Stroke Father    Diabetes Brother    Heart disease Brother     Social History Social History   Tobacco Use   Smoking status: Former    Smokeless tobacco: Never     Allergies   Ciprofloxacin, Metronidazole, Prednisone, and Promethazine   Review of Systems Review of Systems  Constitutional:  Negative for chills and fever.  HENT:  Positive for postnasal drip, rhinorrhea, sinus pressure and sinus pain. Negative for ear pain and sore throat.   Eyes:  Negative for pain and visual disturbance.  Respiratory:  Positive for cough. Negative for shortness of breath.   Cardiovascular:  Negative for chest pain and palpitations.  Gastrointestinal:  Negative for abdominal pain, constipation, diarrhea, nausea and vomiting.  Genitourinary:  Negative for dysuria and hematuria.  Musculoskeletal:  Negative for arthralgias and back pain.  Skin:  Negative for color change and rash.  Neurological:  Negative for seizures and syncope.  All other systems reviewed and are negative.    Physical Exam Triage Vital Signs ED Triage Vitals  Encounter Vitals Group     BP 07/11/23 0941 (!) 146/80     Systolic BP Percentile --      Diastolic BP Percentile --      Pulse Rate 07/11/23 0941 64     Resp 07/11/23 0941 18     Temp 07/11/23 0941 98 F (36.7 C)     Temp Source 07/11/23 0941 Oral     SpO2 07/11/23 0941 96 %     Weight --      Height --      Head Circumference --      Peak Flow --      Pain Score 07/11/23 0940 0     Pain Loc --      Pain Education --      Exclude from Growth Chart --    No data found.  Updated Vital Signs BP (!) 146/80 (BP Location: Right Arm)   Pulse 64   Temp 98 F (36.7 C) (Oral)   Resp 18   SpO2 96%   Visual Acuity Right Eye Distance:   Left Eye Distance:   Bilateral Distance:    Right Eye Near:   Left Eye Near:    Bilateral Near:     Physical Exam Vitals and nursing note reviewed.  Constitutional:      General: He is not in acute distress.    Appearance: He is well-developed. He is not ill-appearing or toxic-appearing.  HENT:     Head: Normocephalic and atraumatic.     Right Ear:  Tympanic membrane and external ear normal. There is impacted cerumen (Mild, not obstructive.  TM visible.).     Left Ear: External ear normal. There is impacted cerumen (Severe.  Total canal blocked with hard wax.  TM not visible.).     Nose: Mucosal edema, congestion and rhinorrhea present. Rhinorrhea is purulent.  Mouth/Throat:     Lips: Pink.     Mouth: Mucous membranes are moist.     Pharynx: Uvula midline. No oropharyngeal exudate or posterior oropharyngeal erythema.     Tonsils: No tonsillar exudate.  Eyes:     Conjunctiva/sclera: Conjunctivae normal.     Pupils: Pupils are equal, round, and reactive to light.  Cardiovascular:     Rate and Rhythm: Normal rate and regular rhythm.     Heart sounds: S1 normal and S2 normal. No murmur heard. Pulmonary:     Effort: Pulmonary effort is normal. No respiratory distress.     Breath sounds: Normal breath sounds. No decreased breath sounds, wheezing, rhonchi or rales.  Abdominal:     Palpations: Abdomen is soft.     Tenderness: There is no abdominal tenderness.  Musculoskeletal:        General: No swelling.     Cervical back: Neck supple.  Lymphadenopathy:     Head:     Right side of head: No submental, submandibular, tonsillar, preauricular or posterior auricular adenopathy.     Left side of head: No submental, submandibular, tonsillar, preauricular or posterior auricular adenopathy.     Cervical: Cervical adenopathy present.     Right cervical: Superficial cervical adenopathy present.     Left cervical: Superficial cervical adenopathy present.  Skin:    General: Skin is warm and dry.     Capillary Refill: Capillary refill takes less than 2 seconds.     Findings: No rash.  Neurological:     Mental Status: He is alert and oriented to person, place, and time.  Psychiatric:        Mood and Affect: Mood normal.      UC Treatments / Results  Labs (all labs ordered are listed, but only abnormal results are displayed) Labs  Reviewed - No data to display  EKG   Radiology No results found.  Procedures Procedures (including critical care time)  Medications Ordered in UC Medications - No data to display  Initial Impression / Assessment and Plan / UC Course  I have reviewed the triage vital signs and the nursing notes.  Pertinent labs & imaging results that were available during my care of the patient were reviewed by me and considered in my medical decision making (see chart for details).  Sinusitis: Cefdinir, 300 mg, twice daily for 7 days.  Encouraged sinus rinses.  Use Mucinex OTC, as directed on the package, to help liquefy mucus.  Has a prednisone allergy so discouraged use of OTC steroid nasal sprays.  Get plenty of fluids and rest.  Return for ear lavage or see PCP for ear lavage.  Follow-up if symptoms do not improve, worsen or new symptoms occur. Final Clinical Impressions(s) / UC Diagnoses   Final diagnoses:  Bilateral impacted cerumen  Acute non-recurrent pansinusitis  Frontal sinus pain     Discharge Instructions      Sinusitis: Encouraged sinus rinses.  Has a prednisone allergy so discouraged use of OTC steroid nasal sprays.  Encouraged Mucinex OTC, as directed on the package, to help liquefy mucus.  Cefdinir, 300 mg, twice daily for 7 days for sinusitis.  Get plenty of fluids and rest.  Return here or to PCP for ear lavage.  Follow-up if symptoms do not improve, worsen or new symptoms occur.     ED Prescriptions     Medication Sig Dispense Auth. Provider   cefdinir (OMNICEF) 300 MG capsule Take 1 capsule (300 mg total) by mouth  2 (two) times daily after a meal for 7 days. 14 capsule Prescilla Sours, FNP      PDMP not reviewed this encounter.   Prescilla Sours, FNP 07/11/23 [phone]

## 2023-07-13 ENCOUNTER — Telehealth: Payer: Self-pay

## 2023-07-13 NOTE — Telephone Encounter (Signed)
-----   Message from Dellia Beckwith sent at 07/13/2023  6:56 AM EST ----- Regarding: call Can tell him CEA was normal

## 2023-07-13 NOTE — Telephone Encounter (Signed)
Patient's wife notified of message and will relay it to patient

## 2023-07-13 NOTE — Progress Notes (Unsigned)
In 2024 Cardiology Office Note:    Date:  07/14/2023   ID:  Michael Moyer Billing, DOB [birth year], MRN [id number]  PCP:  Street, Stephanie Coup, MD  Cardiologist:  Norman Herrlich, MD   Referring MD: [address], Stephanie Coup, *  ASSESSMENT:    1. Weakness   2. Chest pain of uncertain etiology   3. Colon cancer metastasized to intra-abdominal lymph node (HCC)   4. Essential hypertension   5. Hyperlipidemia, unspecified hyperlipidemia type   6. Diabetes 1.5, managed as type 2 (HCC)   7. Precordial pain    PLAN:    In order of problems listed above:  Although nonspecific his symptoms point to a cardiology etiology I think he needs further evaluation specifically for CAD with a cardiac CTA cardiomyopathy with echocardiogram and conduction system disease with an event monitor. He has recovered from his colon cancer no evidence of recurrence Blood pressure at target he will continue his treatment including Zestril 10 mg a day Continue his statin atorvastatin 40 mg daily Stable diabetes managed by his PCP  Next appointment 3 months   Medication Adjustments/Labs and Tests Ordered: Current medicines are reviewed at length with the patient today.  Concerns regarding medicines are outlined above.  Orders Placed This Encounter  Procedures   EKG 12-Lead   No orders of the defined types were placed in this encounter.     History of Present Illness:    Michael Moyer is a 73 y.o. male who is being seen today for the evaluation of the patient complained of weakness he called and self referred himself to my office today.  He saw Dr. Tomie China June 2020 chest discomfort he had an echocardiogram performed which was normal myocardial perfusion study low risk normal perfusion normal function.  He follows with oncology Dr. Gilman Buttner for colorectal cancer he had a CT performed 06/30/2022 which showed no evidence of cancer recurrence diastasis the abdominal aorta is normal caliber he had  atherosclerotic calcification noted.  He was seen by his family doctor 05/17/2023 history is important for hypertension diabetes and hyperlipidemia and colon cancer following oncology.  Review of the office note shows no mention of cardiology.  Laboratory studies performed including CBC with normal 0.7 and platelets low normal 137,000 CMP showed a GFR greater than 60 creatinine 1.0 potassium 4.7 sodium 1 profile showed a cholesterol of 3 LDL 49 non-HDL cholesterol 83 Free T4 as well as TSH  His concern is that is a cancer survivor with a strong family history of CAD he has unrecognized heart problems Focuses on weakness that he has had since his chemotherapy but really his complaint is profound has intolerance he is to stop and rest to recover he has not had palpitation or syncope he does not have edema or shortness of breath but he gets intermittent chest discomfort nonexertional but resolved spontaneously in 10 or 15 minutes All of this has made him question if he has unrecognized heart disease He certainly is at increased cardiovascular risk including his cancer and chemotherapy He is also at risk for cardiomyopathy and conduction system disease with his right bundle branch block  Past Medical History:  Diagnosis Date   Admission for fitting and adjustment of vascular catheter 08/17/2017   BMI 28.0-28.9,adult    Colon cancer metastasized to intra-abdominal lymph node (HCC) 03/31/2018   Found by rising CEA, resected     Colonic mass 06/23/2016   Diabetes 1.5, managed as type 2 (HCC) 06/30/2022   Dyslipidemia  Iron deficiency anemia due to chronic blood loss 07/01/2016   LLQ pain 04/06/2016   Malignant neoplasm of transverse colon (HCC) 07/07/2016   Omental mass 04/15/2018   Overweight    Personal history of colon cancer 06/24/2020   Polyneuropathy in other diseases classified elsewhere [facility])    Postoperative examination 07/28/2016   Shingles    Type 2 diabetes mellitus with  hyperlipidemia (HCC)     Past Surgical History:  Procedure Laterality Date   BACK SURGERY     CHOLECYSTECTOMY     HERNIA REPAIR      Current Medications: Current Meds  Medication Sig   acetaminophen (TYLENOL) 500 MG tablet Take 1,000 mg by mouth as needed for mild pain (pain score 1-3) or moderate pain (pain score 4-6).   atorvastatin (LIPITOR) 40 MG tablet Take 1 tablet by mouth daily.   cefdinir (OMNICEF) 300 MG capsule Take 1 capsule (300 mg total) by mouth 2 (two) times daily after a meal for 7 days.   famotidine (PEPCID) 40 MG tablet Take 40 mg by mouth daily.   lisinopril (ZESTRIL) 10 MG tablet Take 1 tablet by mouth daily.   melatonin 1 MG TABS tablet Take 1 mg by mouth at bedtime.   metFORMIN (GLUCOPHAGE-XR) 500 MG 24 hr tablet Take 500 mg by mouth 2 (two) times daily.   omeprazole (PRILOSEC) 40 MG capsule Take 40 mg by mouth daily.   pregabalin (LYRICA) 75 MG capsule Take 75 mg by mouth daily.     Allergies:   Ciprofloxacin, Metronidazole, Prednisone, and Promethazine   Social History   Socioeconomic History   Marital status: Married    Spouse name: Not on file   Number of children: Not on file   Years of education: Not on file   Highest education level: Not on file  Occupational History   Not on file  Tobacco Use   Smoking status: Former   Smokeless tobacco: Never  Substance and Sexual Activity   Alcohol use: Not on file   Drug use: Not on file   Sexual activity: Not on file  Other Topics Concern   Not on file  Social History Narrative   ** Merged History Encounter **       Social Drivers of Health   Financial Resource Strain: Not on file  Food Insecurity: Not on file  Transportation Needs: Not on file  Physical Activity: Not on file  Stress: Not on file  Social Connections: Not on file     Family History: The patient's  family history includes Diabetes in his brother; Heart disease in his brother; Stroke in his father.  ROS:   ROS Please see  the history of present illness.     All other systems reviewed and are negative.  EKGs/Labs/Other Studies Reviewed:    The following studies were reviewed today:   Cardiac Studies & Procedures     STRESS TESTS  MYOCARDIAL PERFUSION IMAGING 02/01/2019  Narrative  Nuclear stress EF: 59%.  The left ventricular ejection fraction is normal (55-65%).  There was no ST segment deviation noted during stress.  The study is normal.  This is a low risk study.  ECHOCARDIOGRAM  ECHOCARDIOGRAM COMPLETE 01/27/2019  Narrative ECHOCARDIOGRAM REPORT    Patient Name:   GEORGES VICTORIO Date of Exam: 01/27/2019 Medical Rec #:  [id number]      Height:       68.0 in Accession #:    [id number]     Weight:  183.0 lb Date of Birth:  10/30/1950      BSA:          1.97 m Patient Age:    73 years       BP:           142/76 mmHg Patient Gender: M              HR:           72 bpm. Exam Location:  [place]   Procedure: 2D Echo  Indications:    Murmur 785.2 / R01.1  History:        Patient has no prior history of Echocardiogram examinations.  Sonographer:    Louie Boston Referring Phys: Rito Ehrlich [facility]  IMPRESSIONS   1. The left ventricle has normal systolic function with an ejection fraction of 60-65%. The cavity size was normal. Left ventricular diastolic Doppler parameters are consistent with impaired relaxation. 2. The right ventricle has normal systolic function. The cavity was normal. There is no increase in right ventricular wall thickness. 3. The aortic valve is grossly normal. Aortic valve regurgitation was not assessed by color flow Doppler. 4. The aorta is normal unless otherwise noted.  FINDINGS Left Ventricle: The left ventricle has normal systolic function, with an ejection fraction of 60-65%. The cavity size was normal. There is no increase in left ventricular wall thickness. Left ventricular diastolic Doppler parameters are consistent with impaired  relaxation.  Right Ventricle: The right ventricle has normal systolic function. The cavity was normal. There is no increase in right ventricular wall thickness.  Left Atrium: Left atrial size was normal in size.  Right Atrium: Right atrial size was normal in size. Right atrial pressure is estimated at 3 mmHg.  Interatrial Septum: No atrial level shunt detected by color flow Doppler.  Pericardium: There is no evidence of pericardial effusion.  Mitral Valve: The mitral valve is normal in structure. Mitral valve regurgitation was not assessed by color flow Doppler.  Tricuspid Valve: The tricuspid valve is normal in structure. Tricuspid valve regurgitation is trivial by color flow Doppler.  Aortic Valve: The aortic valve is grossly normal Aortic valve regurgitation was not assessed by color flow Doppler.  Pulmonic Valve: The pulmonic valve was not well visualized. Pulmonic valve regurgitation was not assessed by color flow Doppler.  Aorta: The aorta is normal unless otherwise noted.  Venous: The inferior vena cava measures 1.90 cm, is normal in size with greater than 50% respiratory variability.   +--------------+--------++ LEFT VENTRICLE         +----------------+---------++ +--------------+--------++ Diastology                PLAX 2D                +----------------+---------++ +--------------+--------++ LV e' lateral:  7.62 cm/s LVIDd:        4.50 cm  +----------------+---------++ +--------------+--------++ LV E/e' lateral:12.1      LVIDs:        2.50 cm  +----------------+---------++ +--------------+--------++ LV e' medial:   8.16 cm/s LV PW:        1.10 cm  +----------------+---------++ +--------------+--------++ LV E/e' medial: 11.3      LV IVS:       1.10 cm  +----------------+---------++ +--------------+--------++ LVOT diam:    2.10 cm  +--------------+--------++ LV SV:        70 ml    +--------------+--------++ LV SV  Index:  34.83    +--------------+--------++ LVOT Area:    3.46 cm +--------------+--------++                        +--------------+--------++  +---------------+----------++  RIGHT VENTRICLE           +---------------+----------++ RV S prime:    10.80 cm/s +---------------+----------++ TAPSE (M-mode):2.1 cm     +---------------+----------++  +---------------+-------++-----------++ LEFT ATRIUM           Index       +---------------+-------++-----------++ LA diam:       4.00 cm2.03 cm/m  +---------------+-------++-----------++ LA Vol (A2C):  58.4 ml29.67 ml/m +---------------+-------++-----------++ LA Vol (A4C):  43.4 ml22.05 ml/m +---------------+-------++-----------++ LA Biplane Vol:50.9 ml25.86 ml/m +---------------+-------++-----------++ +------------+---------++-----------++ RIGHT ATRIUM         Index       +------------+---------++-----------++ RA Area:    14.60 cm            +------------+---------++-----------++ RA Volume:  35.60 ml 18.09 ml/m +------------+---------++-----------++ +------------+-----------++ AORTIC VALVE            +------------+-----------++ LVOT Vmax:  96.10 cm/s  +------------+-----------++ LVOT Vmean: 59.400 cm/s +------------+-----------++ LVOT VTI:   0.225 m     +------------+-----------++  +-------------+-------++ AORTA                +-------------+-------++ Ao Root diam:3.60 cm +-------------+-------++ Ao Asc diam: 3.60 cm +-------------+-------++  +--------------+----------++ +---------------+-----------++ MITRAL VALVE             TRICUSPID VALVE            +--------------+----------++ +---------------+-----------++ MV Area (PHT):3.85 cm   TR Peak grad:  10.0 mmHg   +--------------+----------++ +---------------+-----------++ MV PHT:       57.13 msec TR Vmax:       160.00  cm/s +--------------+----------++ +---------------+-----------++ MV Decel Time:197 msec   +--------------+----------++ +--------------+-------+ +--------------+----------++ SHUNTS                MV E velocity:92.20 cm/s +--------------+-------+ +--------------+----------++ Systemic VTI: 0.22 m  MV A velocity:77.10 cm/s +--------------+-------+ +--------------+----------++ Systemic Diam:2.10 cm MV E/A ratio: 1.20       +--------------+-------+ +--------------+----------++  +---------+-------+ IVC              +---------+-------+ IVC diam:1.90 cm +---------+-------+   Belva Crome MD Electronically signed by Belva Crome MD Signature Date/Time: 01/27/2019/12:44:38 PM    Final                His EKG today shows right bundle branch block sinus rhythm EKG Interpretation Date/Time:  Wednesday July 14 2023 08:44:11 EST Ventricular Rate:  64 PR Interval:  154 QRS Duration:  126 QT Interval:  408 QTC Calculation: 420 R Axis:   -25  Text Interpretation: Normal sinus rhythm Right bundle branch block Abnormal ECG No previous ECGs available Confirmed by Norman Herrlich ([id number]) on 07/14/2023 10:17:43 AM    Recent Labs: 07/06/2023: ALT 27; BUN 16; Creatinine 1.04; Hemoglobin 16.1; Platelet Count 141; Potassium 4.4; Sodium 136  Recent Lipid Panel    Component Value Date/Time   CHOL 105 06/30/2022 1013   TRIG 157 (H) 06/30/2022 1013   HDL 17 (L) 06/30/2022 1013   CHOLHDL 6.2 06/30/2022 1013   VLDL 31 06/30/2022 1013   LDLCALC 57 06/30/2022 1013    Physical Exam:    VS:  BP (!) 148/76   Pulse 64   Ht 5\' 7"  (1.702 m)   Wt 177 lb (80.3 kg)   SpO2 96%   BMI 27.72 kg/m     Wt Readings from Last 3 Encounters:  07/14/23 177 lb (80.3 kg)  07/06/23 175 lb 12.8 oz (79.7 kg)  12/31/22 179 lb 9.6 oz (81.5 kg)     GEN:  Well nourished, well developed in no acute distress HEENT: Normal NECK: No JVD; No carotid bruits LYMPHATICS: No  lymphadenopathy CARDIAC: RRR, no murmurs, rubs, gallops RESPIRATORY:  Clear to auscultation without rales, wheezing or rhonchi  ABDOMEN: Soft, non-tender, non-distended MUSCULOSKELETAL:  No edema; No deformity  SKIN: Warm and dry NEUROLOGIC:  Alert and oriented x 3 PSYCHIATRIC:  Normal affect     Signed, Norman Herrlich, MD  07/14/2023 9:15 AM    [facility]

## 2023-07-14 ENCOUNTER — Ambulatory Visit: Payer: Medicare Other | Attending: Cardiology | Admitting: Cardiology

## 2023-07-14 ENCOUNTER — Ambulatory Visit: Payer: Medicare Other | Attending: Cardiology

## 2023-07-14 ENCOUNTER — Encounter: Payer: Self-pay | Admitting: Cardiology

## 2023-07-14 VITALS — BP 148/76 | HR 64 | Ht 67.0 in | Wt 177.0 lb

## 2023-07-14 DIAGNOSIS — E785 Hyperlipidemia, unspecified: Secondary | ICD-10-CM | POA: Diagnosis not present

## 2023-07-14 DIAGNOSIS — C189 Malignant neoplasm of colon, unspecified: Secondary | ICD-10-CM | POA: Insufficient documentation

## 2023-07-14 DIAGNOSIS — I4510 Unspecified right bundle-branch block: Secondary | ICD-10-CM | POA: Insufficient documentation

## 2023-07-14 DIAGNOSIS — R531 Weakness: Secondary | ICD-10-CM | POA: Insufficient documentation

## 2023-07-14 DIAGNOSIS — I10 Essential (primary) hypertension: Secondary | ICD-10-CM | POA: Diagnosis not present

## 2023-07-14 DIAGNOSIS — I4589 Other specified conduction disorders: Secondary | ICD-10-CM

## 2023-07-14 DIAGNOSIS — R072 Precordial pain: Secondary | ICD-10-CM | POA: Diagnosis not present

## 2023-07-14 DIAGNOSIS — C772 Secondary and unspecified malignant neoplasm of intra-abdominal lymph nodes: Secondary | ICD-10-CM | POA: Insufficient documentation

## 2023-07-14 DIAGNOSIS — R079 Chest pain, unspecified: Secondary | ICD-10-CM

## 2023-07-14 DIAGNOSIS — E139 Other specified diabetes mellitus without complications: Secondary | ICD-10-CM | POA: Insufficient documentation

## 2023-07-14 MED ORDER — METOPROLOL TARTRATE 25 MG PO TABS: 25.0000 mg | ORAL_TABLET | Freq: Once | ORAL | 0 refills | Status: DC

## 2023-07-14 NOTE — Patient Instructions (Signed)
Medication Instructions:  Your physician recommends that you continue on your current medications as directed. Please refer to the Current Medication list given to you today.  *If you need a refill on your cardiac medications before your next appointment, please call your pharmacy*   Lab Work: Your physician recommends that you return for lab work in:   Labs today: BMP  If you have labs (blood work) drawn today and your tests are completely normal, you will receive your results only by: MyChart Message (if you have MyChart) OR A paper copy in the mail If you have any lab test that is abnormal or we need to change your treatment, we will call you to review the results.   Testing/Procedures: Your physician has requested that you have an echocardiogram. Echocardiography is a painless test that uses sound waves to create images of your heart. It provides your doctor with information about the size and shape of your heart and how well your heart's chambers and valves are working. This procedure takes approximately one hour. There are no restrictions for this procedure. Please do NOT wear cologne, perfume, aftershave, or lotions (deodorant is allowed). Please arrive 15 minutes prior to your appointment time.  Please note: We ask at that you not bring children with you during ultrasound (echo/ vascular) testing. Due to room size and safety concerns, children are not allowed in the ultrasound rooms during exams. Our front office staff cannot provide observation of children in our lobby area while testing is being conducted. An adult accompanying a patient to their appointment will only be allowed in the ultrasound room at the discretion of the ultrasound technician under special circumstances. We apologize for any inconvenience.  A zio monitor was ordered today. It will remain on for 7 days. You will then return monitor and event diary in provided box. It takes 1-2 weeks for report to be downloaded  and returned to [place]. We will call you with the results. If monitor falls off or has orange flashing light, please call Zio for further instructions.     Your cardiac CT will be scheduled at one of the below locations:   [facility] [address] [place], [place] [address] [phone]  OR  [facility] [address] [place], [place] [address] [phone]  OR   [facility] [address] [place], [place] [address] [phone]  OR   [facility] [address] [place], [place] [address] [phone]  If scheduled at [facility], please arrive at the [facility] and Children's Entrance (Entrance C2) of [facility] 30 minutes prior to test start time. You can use the FREE valet parking offered at entrance C (encouraged to control the heart rate for the test)  Proceed to the [facility] Radiology Department (first floor) to check-in and test prep.  All radiology patients and guests should use entrance C2 at [facility], accessed from [facility], even though the hospital's physical address listed is [address]    If scheduled at [facility] or [facility], please arrive 15 mins early for check-in and test prep.  There is spacious parking and easy access to the radiology department from the [facility] entrance. Please enter here and check-in with the desk attendant.   Please follow these instructions carefully (unless otherwise directed):  An IV will be required for this test  and Nitroglycerin will be given.  Hold all erectile dysfunction medications at least 3 days (72 hrs) prior to test. (Ie viagra, cialis, sildenafil, tadalafil, etc)   On the Night Before the Test: Be sure to Drink plenty of water. Do not consume any caffeinated/decaffeinated beverages or chocolate 12  hours prior to your test. Do not take any antihistamines 12 hours prior to your test.  On the Day of the Test: Drink plenty of water until 1 hour prior to the test. Do not eat any food 1 hour prior to test. You may take your regular medications prior to the test.  Take metoprolol (Lopressor) two hours prior to test. Patients who wear a continuous glucose monitor MUST remove the device prior to scanning.      After the Test: Drink plenty of water. After receiving IV contrast, you may experience a mild flushed feeling. This is normal. On occasion, you may experience a mild rash up to 24 hours after the test. This is not dangerous. If this occurs, you can take Benadryl 25 mg and increase your fluid intake. If you experience trouble breathing, this can be serious. If it is severe call 911 IMMEDIATELY. If it is mild, please call our office.  We will call to schedule your test 2-4 weeks out understanding that some insurance companies will need an authorization prior to the service being performed.   For more information and frequently asked questions, please visit our website : [email or web address]  For non-scheduling related questions, please contact the cardiac imaging nurse navigator should you have any questions/concerns: Cardiac Imaging Nurse Navigators Direct Office Dial: [phone]   For scheduling needs, including cancellations and rescheduling, please call [place], [phone].     Follow-Up: At [facility], you and your health needs are our priority.  As part of our continuing mission to provide you with exceptional heart care, we have created designated Provider Care Teams.  These Care Teams include your primary Cardiologist (physician) and Advanced Practice Providers (APPs -  Physician Assistants and Nurse Practitioners) who all work together to provide you with the care you need, when you need it.  We recommend signing up for the patient portal called  "MyChart".  Sign up information is provided on this After Visit Summary.  MyChart is used to connect with patients for Virtual Visits (Telemedicine).  Patients are able to view lab/test results, encounter notes, upcoming appointments, etc.  Non-urgent messages can be sent to your provider as well.   To learn more about what you can do with MyChart, go to [email or web address].    Your next appointment:   3 month(s)  Provider:   Norman Herrlich, MD    Other Instructions None

## 2023-07-16 DIAGNOSIS — Z7984 Long term (current) use of oral hypoglycemic drugs: Secondary | ICD-10-CM | POA: Diagnosis not present

## 2023-07-16 DIAGNOSIS — E119 Type 2 diabetes mellitus without complications: Secondary | ICD-10-CM | POA: Diagnosis not present

## 2023-07-27 ENCOUNTER — Telehealth (HOSPITAL_COMMUNITY): Payer: Self-pay | Admitting: *Deleted

## 2023-07-27 NOTE — Telephone Encounter (Signed)
Reaching out to patient to offer assistance regarding upcoming cardiac imaging study; pt verbalizes understanding of appt date/time, parking situation and where to check in, pre-test NPO status and medications ordered, and verified current allergies; name and call back number provided for further questions should they arise  Larey Brick RN Navigator Cardiac Imaging [facility] [phone] office [phone] cell  Patient to take 25mg  metoprolol tartrate two hours prior to his cardiac CT scan if HR is greater than 65 bpm. He is aware to arrive at 7:30 AM.

## 2023-07-28 ENCOUNTER — Ambulatory Visit (HOSPITAL_BASED_OUTPATIENT_CLINIC_OR_DEPARTMENT_OTHER)
Admission: RE | Admit: 2023-07-28 | Discharge: 2023-07-28 | Disposition: A | Discharge status: Home / Self Care | Payer: Medicare Other | Source: Ambulatory Visit | Attending: Cardiology | Admitting: Cardiology

## 2023-07-28 ENCOUNTER — Other Ambulatory Visit: Payer: Self-pay | Admitting: Cardiology

## 2023-07-28 ENCOUNTER — Ambulatory Visit (HOSPITAL_COMMUNITY)
Admission: RE | Admit: 2023-07-28 | Discharge: 2023-07-28 | Disposition: A | Discharge status: Home / Self Care | Payer: Medicare Other | Source: Ambulatory Visit | Attending: Cardiology | Admitting: Cardiology

## 2023-07-28 DIAGNOSIS — R931 Abnormal findings on diagnostic imaging of heart and coronary circulation: Secondary | ICD-10-CM

## 2023-07-28 DIAGNOSIS — R072 Precordial pain: Secondary | ICD-10-CM | POA: Diagnosis not present

## 2023-07-28 DIAGNOSIS — I2510 Atherosclerotic heart disease of native coronary artery without angina pectoris: Secondary | ICD-10-CM | POA: Diagnosis not present

## 2023-07-28 DIAGNOSIS — R079 Chest pain, unspecified: Secondary | ICD-10-CM | POA: Diagnosis not present

## 2023-07-28 DIAGNOSIS — R531 Weakness: Secondary | ICD-10-CM | POA: Diagnosis not present

## 2023-07-28 MED ORDER — NITROGLYCERIN 0.4 MG SL SUBL
0.8000 mg | SUBLINGUAL_TABLET | Freq: Once | SUBLINGUAL | Status: AC
  Administered 2023-07-28: 0.8 mg via SUBLINGUAL

## 2023-07-28 MED ORDER — NITROGLYCERIN 0.4 MG SL SUBL
SUBLINGUAL_TABLET | SUBLINGUAL | Status: AC
  Filled 2023-07-28: qty 2

## 2023-07-28 MED ORDER — METOPROLOL TARTRATE 5 MG/5ML IV SOLN: 10.0000 mg | Freq: Once | INTRAVENOUS | Status: DC | PRN

## 2023-07-28 MED ORDER — DILTIAZEM HCL 25 MG/5ML IV SOLN: 10.0000 mg | INTRAVENOUS | Status: DC | PRN

## 2023-07-28 MED ORDER — METOPROLOL TARTRATE 5 MG/5ML IV SOLN
INTRAVENOUS | Status: AC
  Filled 2023-07-28: qty 15

## 2023-07-28 MED ORDER — IOHEXOL 350 MG/ML SOLN
100.0000 mL | Freq: Once | INTRAVENOUS | Status: AC | PRN
  Administered 2023-07-28: 100 mL via INTRAVENOUS

## 2023-07-30 ENCOUNTER — Other Ambulatory Visit: Payer: Self-pay

## 2023-07-30 MED ORDER — CLOPIDOGREL BISULFATE 75 MG PO TABS: 75.0000 mg | ORAL_TABLET | Freq: Every day | ORAL | 3 refills | Status: AC

## 2023-07-30 MED ORDER — METOPROLOL SUCCINATE ER 25 MG PO TB24: 25.0000 mg | ORAL_TABLET | Freq: Every day | ORAL | 3 refills | Status: AC

## 2023-08-01 DIAGNOSIS — I4589 Other specified conduction disorders: Secondary | ICD-10-CM

## 2023-08-01 DIAGNOSIS — R531 Weakness: Secondary | ICD-10-CM

## 2023-08-01 DIAGNOSIS — C772 Secondary and unspecified malignant neoplasm of intra-abdominal lymph nodes: Secondary | ICD-10-CM

## 2023-08-01 DIAGNOSIS — R072 Precordial pain: Secondary | ICD-10-CM | POA: Diagnosis not present

## 2023-08-01 DIAGNOSIS — E785 Hyperlipidemia, unspecified: Secondary | ICD-10-CM | POA: Diagnosis not present

## 2023-08-01 DIAGNOSIS — C189 Malignant neoplasm of colon, unspecified: Secondary | ICD-10-CM | POA: Diagnosis not present

## 2023-08-01 DIAGNOSIS — E139 Other specified diabetes mellitus without complications: Secondary | ICD-10-CM

## 2023-08-01 DIAGNOSIS — I10 Essential (primary) hypertension: Secondary | ICD-10-CM | POA: Diagnosis not present

## 2023-08-01 DIAGNOSIS — R079 Chest pain, unspecified: Secondary | ICD-10-CM | POA: Diagnosis not present

## 2023-08-04 ENCOUNTER — Ambulatory Visit: Payer: Medicare Other | Attending: Cardiology

## 2023-08-04 DIAGNOSIS — R531 Weakness: Secondary | ICD-10-CM | POA: Insufficient documentation

## 2023-08-04 DIAGNOSIS — R072 Precordial pain: Secondary | ICD-10-CM | POA: Insufficient documentation

## 2023-08-04 DIAGNOSIS — E785 Hyperlipidemia, unspecified: Secondary | ICD-10-CM | POA: Insufficient documentation

## 2023-08-04 DIAGNOSIS — C189 Malignant neoplasm of colon, unspecified: Secondary | ICD-10-CM | POA: Diagnosis not present

## 2023-08-04 DIAGNOSIS — I10 Essential (primary) hypertension: Secondary | ICD-10-CM | POA: Diagnosis not present

## 2023-08-04 DIAGNOSIS — E139 Other specified diabetes mellitus without complications: Secondary | ICD-10-CM | POA: Insufficient documentation

## 2023-08-04 DIAGNOSIS — R079 Chest pain, unspecified: Secondary | ICD-10-CM | POA: Diagnosis not present

## 2023-08-04 DIAGNOSIS — C772 Secondary and unspecified malignant neoplasm of intra-abdominal lymph nodes: Secondary | ICD-10-CM | POA: Diagnosis not present

## 2023-08-04 LAB — ECHOCARDIOGRAM COMPLETE: S' Lateral: 2.6 cm

## 2023-08-05 ENCOUNTER — Encounter: Payer: Self-pay | Admitting: Cardiology

## 2023-08-17 ENCOUNTER — Other Ambulatory Visit: Payer: Self-pay | Admitting: Cardiology

## 2023-10-13 ENCOUNTER — Ambulatory Visit: Payer: Medicare Other | Admitting: Cardiology

## 2023-11-18 ENCOUNTER — Encounter: Payer: Self-pay | Admitting: Cardiology

## 2023-12-03 DIAGNOSIS — M7541 Impingement syndrome of right shoulder: Secondary | ICD-10-CM | POA: Diagnosis not present

## 2023-12-13 ENCOUNTER — Ambulatory Visit: Admitting: Cardiology

## 2024-02-06 NOTE — Progress Notes (Unsigned)
 Cardiology Office Note:    Date:  02/11/2024   ID:  Michael Moyer, DOB [birth year], MRN [id number]  PCP:  Street, Michael HERO, MD  Cardiologist:  Michael Leiter, MD    Referring MD: [address], Michael Moyer, *please do an LP(a) and APO B with his upcoming labs in your office with a lipid profile   ASSESSMENT:    1. Coronary artery disease of native artery of native heart with stable angina pectoris (HCC)   2. Agatston coronary artery calcium score greater than 400   3. Weakness   4. Essential hypertension   5. Hyperlipidemia, unspecified hyperlipidemia type   6. Colon cancer metastasized to intra-abdominal lymph node (HCC)    PLAN:    In order of problems listed above:  Michael Moyer is doing well with CAD no anginal discomfort tolerating treatment continue clopidogrel  beta-blocker statin Hypertension controlled on his current medical regimen including ACE inhibitor he will continue the same Continue his high intensity statin he has upcoming labs with his PCP and I will asked them to check an LP(a) as a one-time screen and APO B which I will use as a marker for optimization as there can be discord between LDL and ApoB with a goal of his April be less than 60 he may require a second agent Stable cancer   Next appointment: 1 year   Medication Adjustments/Labs and Tests Ordered: Current medicines are reviewed at length with the patient today.  Concerns regarding medicines are outlined above.  No orders of the defined types were placed in this encounter.  No orders of the defined types were placed in this encounter.    History of Present Illness:    Michael Moyer is a 73 y.o. male with a hx of colorectal cancer followed by local oncology hypertension diabetes hyperlipidemia last seen 07/14/2023.  He had a profound complaint of weakness echocardiogram was performed showing normal left ventricular size wall thickness ejection fraction normal 60 to 65% and normal GLS.  Right  ventricle is also normal and there is mild mitral regurgitation.  With his increased CAD risk in a cancer survivor he underwent cardiac CTA showed a very high calcium score 1905 and CAD 50 to 69% ostial LAD 25 to 49% left main coronary artery 50 to 69% stenosis right coronary artery and was initiated on beta-blocker therapy.  He was placed on beta-blocker and clopidogrel .  Compliance with diet, lifestyle and medications: Yes  His wife is present participates in evaluation decision making His fatigue has resolved He is feeling well and is not having cardiovascular symptoms of shortness of breath edema chest pain palpitation or syncope He tolerates his clopidogrel  without bleeding but bruises easily asked him to start a multivitamin daily that can help and he has no problem with muscle pain or weakness with his statin. Past Medical History:  Diagnosis Date   Admission for fitting and adjustment of vascular catheter 08/17/2017   BMI 28.0-28.9,adult    Colon cancer metastasized to intra-abdominal lymph node (HCC) 03/31/2018   Found by rising CEA, resected     Colonic mass 06/23/2016   Diabetes 1.5, managed as type 2 (HCC) 06/30/2022   Dyslipidemia    Iron deficiency anemia due to chronic blood loss 07/01/2016   LLQ pain 04/06/2016   Malignant neoplasm of transverse colon (HCC) 07/07/2016   Omental mass 04/15/2018   Overweight    Personal history of colon cancer 06/24/2020   Polyneuropathy in other diseases classified elsewhere [facility])    Postoperative  examination 07/28/2016   Shingles    Type 2 diabetes mellitus with hyperlipidemia (HCC)     Current Medications: Current Meds  Medication Sig   acetaminophen  (TYLENOL ) 500 MG tablet Take 1,000 mg by mouth as needed for mild pain (pain score 1-3) or moderate pain (pain score 4-6).   atorvastatin (LIPITOR) 40 MG tablet Take 1 tablet by mouth daily.   clopidogrel  (PLAVIX ) 75 MG tablet Take 1 tablet (75 mg total) by mouth daily.   famotidine  (PEPCID) 40 MG tablet Take 40 mg by mouth daily.   lisinopril (ZESTRIL) 10 MG tablet Take 1 tablet by mouth daily.   melatonin 1 MG TABS tablet Take 1 mg by mouth at bedtime.   metFORMIN (GLUCOPHAGE-XR) 500 MG 24 hr tablet Take 500 mg by mouth 2 (two) times daily.   metoprolol  succinate (TOPROL  XL) 25 MG 24 hr tablet Take 1 tablet (25 mg total) by mouth daily.   omeprazole (PRILOSEC) 40 MG capsule Take 40 mg by mouth daily.   pregabalin (LYRICA) 75 MG capsule Take 75 mg by mouth daily.      EKGs/Labs/Other Studies Reviewed:    The following studies were reviewed today:  Cardiac Studies & Procedures   ______________________________________________________________________________________________   STRESS TESTS  MYOCARDIAL PERFUSION IMAGING 02/01/2019  Interpretation Summary  Nuclear stress EF: 59%.  The left ventricular ejection fraction is normal (55-65%).  There was no ST segment deviation noted during stress.  The study is normal.  This is a low risk study.   ECHOCARDIOGRAM  ECHOCARDIOGRAM COMPLETE 08/04/2023  Narrative ECHOCARDIOGRAM REPORT    Patient Name:   Michael Moyer Date of Exam: 08/04/2023 Medical Rec #:  [id number]            Height:       67.0 in Accession #:    [id number]           Weight:       177.0 lb Date of Birth:  Jan 07, 1951            BSA:          1.920 m Patient Age:    72 years             BP:           116/72 mmHg Patient Gender: M                    HR:           64 bpm. Exam Location:  [place]  Procedure: 2D Echo and Strain Analysis (Both Spectral and Color Flow Doppler were utilized during procedure).  Indications:    Weakness [R53.1 (ICD-10-CM)]; Chest pain of uncertain etiology [R07.9 (ICD-10-CM)]; Colon cancer metastasized to intra-abdominal lymph node (HCC) [C18.9, C77.2 (ICD-10-CM)]  History:        Patient has prior history of Echocardiogram examinations, most recent 01/27/2019. Signs/Symptoms:Syncope and Chest Pain;  Risk Factors:Dyslipidemia, Diabetes and Hypertension.  Sonographer:    Lynwood Silvas RDCS Referring Phys: [id number] Michael Moyer  IMPRESSIONS   1. Left ventricular ejection fraction, by estimation, is 60 to 65%. The left ventricle has normal function. The left ventricle has no regional wall motion abnormalities. Left ventricular diastolic parameters were normal. The average left ventricular global longitudinal strain is -19.2 %. The global longitudinal strain is normal. 2. Right ventricular systolic function is normal. The right ventricular size is normal. There is normal pulmonary artery systolic pressure. 3. The mitral valve is degenerative. Mild mitral valve  regurgitation. No evidence of mitral stenosis. 4. The aortic valve is tricuspid. There is mild thickening of the aortic valve. Aortic valve regurgitation is not visualized. No aortic stenosis is present. 5. The inferior vena cava is normal in size with greater than 50% respiratory variability, suggesting right atrial pressure of 3 mmHg.  Comparison(s): No prior Echocardiogram.  FINDINGS Left Ventricle: Left ventricular ejection fraction, by estimation, is 60 to 65%. The left ventricle has normal function. The left ventricle has no regional wall motion abnormalities. The average left ventricular global longitudinal strain is -19.2 %. Strain was performed and the global longitudinal strain is normal. The left ventricular internal cavity size was normal in size. There is no left ventricular hypertrophy. Left ventricular diastolic parameters were normal.  Right Ventricle: The right ventricular size is normal. Right vetricular wall thickness was not well visualized. Right ventricular systolic function is normal. There is normal pulmonary artery systolic pressure. The tricuspid regurgitant velocity is 1.80 m/s, and with an assumed right atrial pressure of 3 mmHg, the estimated right ventricular systolic pressure is 16.0 mmHg.  Left Atrium: Left  atrial size was normal in size.  Right Atrium: Right atrial size was normal in size.  Pericardium: There is no evidence of pericardial effusion.  Mitral Valve: The mitral valve is degenerative in appearance. Mild mitral valve regurgitation. No evidence of mitral valve stenosis.  Tricuspid Valve: The tricuspid valve is grossly normal. Tricuspid valve regurgitation is mild . No evidence of tricuspid stenosis.  Aortic Valve: The aortic valve is tricuspid. There is mild thickening of the aortic valve. Aortic valve regurgitation is not visualized. No aortic stenosis is present.  Pulmonic Valve: The pulmonic valve was not well visualized. Pulmonic valve regurgitation is mild. No evidence of pulmonic stenosis.  Aorta: The aortic root and ascending aorta are structurally normal, with no evidence of dilitation.  Venous: The inferior vena cava is normal in size with greater than 50% respiratory variability, suggesting right atrial pressure of 3 mmHg.  IAS/Shunts: The interatrial septum was not well visualized.  Additional Comments: 3D imaging was not performed.   LEFT VENTRICLE PLAX 2D LVIDd:         4.10 cm   Diastology LVIDs:         2.60 cm   LV e' medial:    5.66 cm/s LV PW:         0.80 cm   LV E/e' medial:  13.8 LV IVS:        0.80 cm   LV e' lateral:   7.40 cm/s LVOT diam:     1.90 cm   LV E/e' lateral: 10.6 LV SV:         57 LV SV Index:   30        2D Longitudinal Strain LVOT Area:     2.84 cm  2D Strain GLS Avg:     -19.2 %   RIGHT VENTRICLE            IVC RV Basal diam:  2.30 cm    IVC diam: 1.30 cm RV S prime:     9.03 cm/s TAPSE (M-mode): 1.9 cm  LEFT ATRIUM             Index        RIGHT ATRIUM           Index LA diam:        2.80 cm 1.46 cm/m   RA Area:     13.50 cm LA  Vol [facility]):   57.7 ml 30.05 ml/m  RA Volume:   28.50 ml  14.84 ml/m LA Vol (A4C):   31.9 ml 16.62 ml/m LA Biplane Vol: 45.5 ml 23.70 ml/m AORTIC VALVE LVOT Vmax:   84.40 cm/s LVOT Vmean:   53.950 cm/s LVOT VTI:    0.202 m  AORTA Ao Root diam: 3.50 cm Ao Asc diam:  3.40 cm Ao Desc diam: 2.35 cm  MV E velocity: 78.10 cm/s  TRICUSPID VALVE MV A velocity: 89.60 cm/s  TR Peak grad:   13.0 mmHg MV E/A ratio:  0.87        TR Vmax:        180.00 cm/s  SHUNTS Systemic VTI:  0.20 m Systemic Diam: 1.90 cm  Sreedhar reddy Madireddy Electronically signed by Alean reddy Madireddy Signature Date/Time: 08/04/2023/1:30:22 PM    Final    MONITORS  LONG TERM MONITOR (3-14 DAYS) 07/29/2023  Narrative Patch Wear Time:  6 days and 22 hours (2025-01-29T09:45:47-0500 to 2025-02-05T07:49:41-0500)  Patient had a min HR of 48 bpm, max HR of 156 bpm, and avg HR of 69 bpm. Predominant underlying rhythm was Sinus Rhythm.  No triggered or diary events.  There were no episodes of sinus pause 3 seconds or greater and no episodes of second or third-degree AV nodal block.  There were no episodes of atrial fibrillation or flutter.  3 Supraventricular Tachycardia runs occurred, the run with the fastest interval lasting 4 beats with a max rate of 156 bpm, the longest lasting 9 beats with an avg rate of 108 bpm.  Isolated SVEs were rare (<1.0%), and no SVE Couplets or SVE Triplets were present.  Isolated VEs were rare (<1.0%), and no VE Couplets or VE Triplets were present. Ventricular Bigeminy was present.   CT SCANS  CT CORONARY MORPH W/CTA COR W/SCORE 07/28/2023  Addendum 08/12/2023 10:31 AM ADDENDUM REPORT: 08/12/2023 10:29  EXAM: OVER-READ INTERPRETATION  CT CHEST  The following report is an over-read performed by radiologist Dr. Fonda Mom [facility] on 08/12/2023. This over-read does not include interpretation of cardiac or coronary anatomy or pathology. The coronary CTA interpretation by the cardiologist is attached.  COMPARISON:  04/25/2021.  FINDINGS: Cardiovascular: Filling defect in the branch of the left lower lobe pulmonary consistent with a  nonocclusive thrombus. Consider follow up if indicated based on this finding from 2 weeks ago. See findings discussed in the body of the report.  Mediastinum/Nodes: No suspicious adenopathy identified. Imaged mediastinal structures are unremarkable.  Lungs/Pleura: There is dependent basilar subsegmental atelectasis. No pneumonia or pulmonary edema. No pleural effusion or pneumothorax.  Upper Abdomen: No acute abnormality.  Musculoskeletal: No chest wall abnormality. No acute osseous findings.  IMPRESSION: Nonocclusive thrombus in the left lower lobe pulmonary artery. Consider follow up if indicated based on this finding from 2 weeks ago.   Electronically Signed By: Fonda Field M.D. On: 08/12/2023 10:29  Narrative CLINICAL DATA:  10M with chest pain  EXAM: Cardiac/Coronary CTA  TECHNIQUE: The patient was scanned on a [employer].  FINDINGS: A 100 kV prospective scan was triggered in the descending thoracic aorta at 111 HU's. Axial non-contrast 3 mm slices were carried out through the heart. The data set was analyzed on a dedicated work station and scored using the Agatson method. Gantry rotation speed was 250 msecs and collimation was .6 mm. No beta blockade and 0.8 mg of sl NTG was given. The 3D data set was reconstructed in 5% intervals of the  35-75% of the R-R cycle. Phases were analyzed on a dedicated work station using MPR, MIP and VRT modes. The patient received 100 cc of contrast.  Coronary Arteries:  Normal coronary origin.  Right dominance.  RCA is a large dominant artery that gives rise to PDA and PLA. Mixed plaque in proximal RCA causes 25-49% stenosis. Mixed plaque in mid RCA causes 50-69% stenosis. Calcified plaque in distal RCA causes 0-24% stenosis  Left main is a large artery that gives rise to LAD and LCX arteries. Mixed plaque in left main causes 25-49% stenosis. High risk plaque features including positive remodeling and napkin  ring sign.  LAD is a large vessel that has no plaque. Mixed plaque in ostial LAD causes 50-69% stenosis. Calcified plaque in proximal LAD causes 0-24% stenosis. Calcified plaque in mid LAD causes 0-24% stenosis. Calcified plaque in distal LAD causes 50-69% stenosis. Calcified plaque in D1 causes 25-49% stenosis  LCX is a non-dominant artery that gives rise to one large OM1 branch. Calcified plaque in proximal LCX causes 25-49% stenosis. Calcified plaque in mid LCX causes 25-49% stenosis. Calcified plaque in OM1 causes 25-49% stenosis  Other findings:  Left Ventricle: Normal size  Left Atrium: Mild enlargement  Pulmonary Veins: Normal configuration  Right Ventricle: Normal size  Right Atrium: Normal size  Cardiac valves: No calcifications  Thoracic aorta: Normal size  Pulmonary Arteries: Normal size  Systemic Veins: Normal drainage  Pericardium: Normal thickness  IMPRESSION: 1. Coronary calcium score of 1905. This was 90th percentile for age and sex matched control.  2. Total plaque volume 1148mm3 which is 76th percentile for age and sex-matched controls (calcified plaque 334mm3; noncalcified plaque 827mm3). TPV is extensive  3.  Normal coronary origin with right dominance.  4. Mixed plaque in left main causes mild (25-49%) stenosis and extends into ostial LAD where it causes moderate (50-69%) stenosis. There are high risk plaque features including positive remodeling and napkin ring sign  5.  Moderate (50-69%) stenosis in mid RCA and distal LAD  6. Mild (25-49%) stenosis in proximal RCA, proximal/mid LCX, OM1, and D1  7.  Minimal (0-24%) stenosis in proximal/mid LAD and distal RCA  8.  Will send study for CTFFR  CAD-RADS 3. Moderate stenosis. Consider symptom-guided anti-ischemic pharmacotherapy as well as risk factor modification per guideline directed care. Additional analysis with CT FFR will be submitted.  Electronically Signed: By: Michael Nanas M.D. On: 07/28/2023 11:59     ______________________________________________________________________________________________          Recent Labs: 07/06/2023: ALT 27; BUN 16; Creatinine 1.04; Hemoglobin 16.1; Platelet Count 141; Potassium 4.4; Sodium 136  Recent Lipid Panel    Component Value Date/Time   CHOL 105 06/30/2022 1013   TRIG 157 (H) 06/30/2022 1013   HDL 17 (L) 06/30/2022 1013   CHOLHDL 6.2 06/30/2022 1013   VLDL 31 06/30/2022 1013   LDLCALC 57 06/30/2022 1013    Physical Exam:    VS:  BP (!) 140/60   Pulse 72   Ht 5' 7 (1.702 m)   Wt 177 lb 3.2 oz (80.4 kg)   SpO2 97%   BMI 27.75 kg/m     Wt Readings from Last 3 Encounters:  02/11/24 177 lb 3.2 oz (80.4 kg)  07/14/23 177 lb (80.3 kg)  07/06/23 175 lb 12.8 oz (79.7 kg)     GEN:  Well nourished, well developed in no acute distress HEENT: Normal NECK: No JVD; No carotid bruits LYMPHATICS: No lymphadenopathy CARDIAC: RRR, no murmurs,  rubs, gallops RESPIRATORY:  Clear to auscultation without rales, wheezing or rhonchi  ABDOMEN: Soft, non-tender, non-distended MUSCULOSKELETAL:  No edema; No deformity  SKIN: Warm and dry NEUROLOGIC:  Alert and oriented x 3 PSYCHIATRIC:  Normal affect    Signed, Michael Leiter, MD  02/11/2024 4:46 PM    [facility]

## 2024-02-11 ENCOUNTER — Encounter: Payer: Self-pay | Admitting: Cardiology

## 2024-02-11 ENCOUNTER — Ambulatory Visit: Attending: Cardiology | Admitting: Cardiology

## 2024-02-11 VITALS — BP 140/60 | HR 72 | Ht 67.0 in | Wt 177.2 lb

## 2024-02-11 DIAGNOSIS — I10 Essential (primary) hypertension: Secondary | ICD-10-CM | POA: Insufficient documentation

## 2024-02-11 DIAGNOSIS — C772 Secondary and unspecified malignant neoplasm of intra-abdominal lymph nodes: Secondary | ICD-10-CM | POA: Insufficient documentation

## 2024-02-11 DIAGNOSIS — E785 Hyperlipidemia, unspecified: Secondary | ICD-10-CM | POA: Diagnosis not present

## 2024-02-11 DIAGNOSIS — R931 Abnormal findings on diagnostic imaging of heart and coronary circulation: Secondary | ICD-10-CM | POA: Insufficient documentation

## 2024-02-11 DIAGNOSIS — R531 Weakness: Secondary | ICD-10-CM | POA: Diagnosis not present

## 2024-02-11 DIAGNOSIS — C189 Malignant neoplasm of colon, unspecified: Secondary | ICD-10-CM | POA: Diagnosis not present

## 2024-02-11 DIAGNOSIS — I25118 Atherosclerotic heart disease of native coronary artery with other forms of angina pectoris: Secondary | ICD-10-CM | POA: Insufficient documentation

## 2024-02-11 NOTE — Patient Instructions (Addendum)

## 2024-02-24 DIAGNOSIS — K2960 Other gastritis without bleeding: Secondary | ICD-10-CM | POA: Diagnosis not present

## 2024-02-24 DIAGNOSIS — I10 Essential (primary) hypertension: Secondary | ICD-10-CM | POA: Diagnosis not present

## 2024-02-24 DIAGNOSIS — Z23 Encounter for immunization: Secondary | ICD-10-CM | POA: Diagnosis not present

## 2024-02-24 DIAGNOSIS — Z1339 Encounter for screening examination for other mental health and behavioral disorders: Secondary | ICD-10-CM | POA: Diagnosis not present

## 2024-02-24 DIAGNOSIS — E1169 Type 2 diabetes mellitus with other specified complication: Secondary | ICD-10-CM | POA: Diagnosis not present

## 2024-02-24 DIAGNOSIS — Z79899 Other long term (current) drug therapy: Secondary | ICD-10-CM | POA: Diagnosis not present

## 2024-02-24 DIAGNOSIS — N138 Other obstructive and reflux uropathy: Secondary | ICD-10-CM | POA: Diagnosis not present

## 2024-02-24 DIAGNOSIS — E785 Hyperlipidemia, unspecified: Secondary | ICD-10-CM | POA: Diagnosis not present

## 2024-02-24 DIAGNOSIS — Z0000 Encounter for general adult medical examination without abnormal findings: Secondary | ICD-10-CM | POA: Diagnosis not present

## 2024-02-24 DIAGNOSIS — N401 Enlarged prostate with lower urinary tract symptoms: Secondary | ICD-10-CM | POA: Diagnosis not present

## 2024-02-24 LAB — LAB REPORT - SCANNED
A1c: 8
Albumin, Urine POC: 3
Albumin/Creatinine Ratio, Urine, POC: 3
Creatinine, POC: 96.7 mg/dL
EGFR: 60
Free T4: 1.06
TSH: 1.73 (ref 0.41–5.90)

## 2024-05-03 ENCOUNTER — Other Ambulatory Visit: Payer: Self-pay

## 2024-05-03 ENCOUNTER — Telehealth: Payer: Self-pay

## 2024-05-03 NOTE — Telephone Encounter (Signed)
 SABRA

## 2024-05-03 NOTE — Telephone Encounter (Signed)
 Received the following message below from Dr. Monetta:  I reviewed his labs with his PCP his LDL and ApoB are elevated I would like him to add Zetia 10 mg daily to his statin.  Attempted to call the patient. The patient did not answer the phone, and the message stated that the phone number was not in service at this time.

## 2024-05-16 DIAGNOSIS — M47812 Spondylosis without myelopathy or radiculopathy, cervical region: Secondary | ICD-10-CM | POA: Diagnosis not present

## 2024-05-31 DIAGNOSIS — E785 Hyperlipidemia, unspecified: Secondary | ICD-10-CM | POA: Diagnosis not present

## 2024-05-31 DIAGNOSIS — E663 Overweight: Secondary | ICD-10-CM | POA: Diagnosis not present

## 2024-05-31 DIAGNOSIS — E1169 Type 2 diabetes mellitus with other specified complication: Secondary | ICD-10-CM | POA: Diagnosis not present

## 2024-05-31 DIAGNOSIS — B3742 Candidal balanitis: Secondary | ICD-10-CM | POA: Diagnosis not present

## 2024-06-01 ENCOUNTER — Other Ambulatory Visit: Payer: Self-pay

## 2024-06-01 MED ORDER — EZETIMIBE 10 MG PO TABS: 10.0000 mg | ORAL_TABLET | Freq: Every day | ORAL | 3 refills | Status: DC

## 2024-07-05 ENCOUNTER — Encounter: Payer: Self-pay | Admitting: Oncology

## 2024-07-05 ENCOUNTER — Telehealth: Payer: Self-pay | Admitting: Oncology

## 2024-07-05 ENCOUNTER — Other Ambulatory Visit: Payer: Self-pay | Admitting: Oncology

## 2024-07-05 ENCOUNTER — Inpatient Hospital Stay: Payer: PRIVATE HEALTH INSURANCE | Attending: Oncology | Admitting: Oncology

## 2024-07-05 ENCOUNTER — Inpatient Hospital Stay: Payer: PRIVATE HEALTH INSURANCE

## 2024-07-05 VITALS — BP 131/63 | HR 58 | Temp 97.6°F | Resp 16 | Ht 67.0 in | Wt 173.6 lb

## 2024-07-05 DIAGNOSIS — C184 Malignant neoplasm of transverse colon: Secondary | ICD-10-CM | POA: Diagnosis not present

## 2024-07-05 DIAGNOSIS — C189 Malignant neoplasm of colon, unspecified: Secondary | ICD-10-CM

## 2024-07-05 DIAGNOSIS — C772 Secondary and unspecified malignant neoplasm of intra-abdominal lymph nodes: Secondary | ICD-10-CM

## 2024-07-05 LAB — CBC WITH DIFFERENTIAL (CANCER CENTER ONLY)
Abs Immature Granulocytes: 0.01 K/uL (ref 0.00–0.07)
Basophils Absolute: 0.1 K/uL (ref 0.0–0.1)
Basophils Relative: 1 %
Eosinophils Absolute: 0.2 K/uL (ref 0.0–0.5)
Eosinophils Relative: 3 %
HCT: 50.6 % (ref 39.0–52.0)
Hemoglobin: 16.3 g/dL (ref 13.0–17.0)
Immature Granulocytes: 0 %
Lymphocytes Relative: 29 %
Lymphs Abs: 2 K/uL (ref 0.7–4.0)
MCH: 28.3 pg (ref 26.0–34.0)
MCHC: 32.2 g/dL (ref 30.0–36.0)
MCV: 87.8 fL (ref 80.0–100.0)
Monocytes Absolute: 0.7 K/uL (ref 0.1–1.0)
Monocytes Relative: 10 %
Neutro Abs: 3.9 K/uL (ref 1.7–7.7)
Neutrophils Relative %: 57 %
Platelet Count: 132 K/uL — ABNORMAL LOW (ref 150–400)
RBC: 5.76 MIL/uL (ref 4.22–5.81)
RDW: 13.7 % (ref 11.5–15.5)
WBC Count: 6.8 K/uL (ref 4.0–10.5)
nRBC: 0 % (ref 0.0–0.2)

## 2024-07-05 LAB — CMP (CANCER CENTER ONLY)
ALT: 30 U/L (ref 0–44)
AST: 27 U/L (ref 15–41)
Albumin: 3.8 g/dL (ref 3.5–5.0)
Alkaline Phosphatase: 75 U/L (ref 38–126)
Anion gap: 8 (ref 5–15)
BUN: 13 mg/dL (ref 8–23)
CO2: 29 mmol/L (ref 22–32)
Calcium: 9.7 mg/dL (ref 8.9–10.3)
Chloride: 106 mmol/L (ref 98–111)
Creatinine: 1.07 mg/dL (ref 0.61–1.24)
GFR, Estimated: >60 mL/min
Glucose, Bld: 153 mg/dL — ABNORMAL HIGH (ref 70–99)
Potassium: 4.7 mmol/L (ref 3.5–5.1)
Sodium: 143 mmol/L (ref 135–145)
Total Bilirubin: 0.9 mg/dL (ref 0.0–1.2)
Total Protein: 6.8 g/dL (ref 6.5–8.1)

## 2024-07-05 LAB — CEA (ACCESS): CEA (CHCC): 1.4 ng/mL (ref 0.00–5.00)

## 2024-07-05 NOTE — Telephone Encounter (Signed)
 Patient has been scheduled for follow-up visit per 07/05/2024 LOS.  Pt given an appt calendar with date and time.

## 2024-07-05 NOTE — Progress Notes (Signed)
 " [facility]  [address] [place],  [place]  [address] [phone]  Clinic Day:  07/05/24  Referring physician: Rusty Lonni HERO, MD   CHIEF COMPLAINT:  CC: Stage IIIC right colon cancer diagnosed in January 2018 with recurrence in November 2019   Current Treatment: Surveillance  HISTORY OF PRESENT ILLNESS:  Michael Moyer is a 74 y.o. male with a history of stage IIIC (T4a N2a M0) right colon cancer diagnosed in January 2018.  He was treated with a right hemicolectomy.  Pathology revealed a 6 cm, grade 2, adenocarcinoma with invasion of the visceral peritoneum adherent to the omentum.  Four of 31 nodes were positive for metastasis.  MMR was normal and MSI stable.  Due to his personal and family history of malignancy, he was offered testing for hereditary cancer syndrome, but he declined.  He was also found to have iron deficiency and was placed on hemocyte daily, which was later discontinued.  He received adjuvant chemotherapy with FOLFOX for 12 cycles completed in August 2018.  He developed neuropathy which worsened over time and is likely multifactorial chemo and diabetes.  Colonoscopy in January 2019 did not reveal any abnormality.  Three year follow-up was recommended.  In April 2019, there was an increase in the CEA to 7.8.  CT chest, abdomen and pelvis in May did not reveal evidence of recurrence.  The CEA was then down to 5.4 in July.  He was seen in October for routine follow up and had an another increase in his CEA to 8.4.  CT chest, abdomen and pelvis revealed a 3.7 cm soft tissue density along the anterior margin of the stomach below and to the left of the left hepatic lobe.  In retrospect, this was felt to be present previously, but much smaller.  There were foci of fat necrosis along the upper omentum, separate from that area.  PET scan revealed intense hypermetabolic activity with an SUV of 7.7 in the the lobular lesion within the lesser omentum.  There were no  other findings suspicious for recurrent/metastatic disease.  He saw Dr. Bert and underwent surgical resection of this mass in November.  Pathology revealed metastatic colorectal adenocarcinoma within a lymph node measuring 4 cm in greatest dimension, with a foci of extranodal extension.  He was seen for follow-up in December and as the recurrence was resected and there was no evidence of disease elsewhere, palliative chemotherapy was not recommended.  We have been following his CEA, which has remained normal. CT imaging in September 2020 did not reveal any evidence of malignancy.  The prior clustered omental nodules in the upper omentum are no longer visible.  There was a 4 mm hypodense lesion in segment 2 of the liver, not readily seen on imaging in October 2019, but probably subtly visible on imaging in May 2019 and felt to be most likely likely benign, but may merit surveillance.  There was a healing new fracture of the right anterior second rib.  Aortic Atherosclerosis and coronary atherosclerosis were seen.  There was descending: And sigmoid colon diverticulosis.  There were old small areas of fat necrosis in the left upper quadrant omentum and right upper quadrant mesentery.  There was chronic pars defects at L5 with impingement at L3-4, L4-5, and especially L5-S1.  CT chest, abdomen and pelvis from September 2021 revealed no findings of active or recurrent malignancy.  Colonoscopy in February 2022 did not reveal any abnormalities.  CT chest, abdomen and pelvis in  November 2022 did not reveal any evidence of recurrent or metastatic disease.  Left colonic diverticulosis without diverticulitis was seen. His last CT scan was done on 06/30/2022 and showed no evidence of colon cancer recurrence or metastasis.   INTERVAL HISTORY:  Cove is here today for repeat clinical assessment of stage IIIC right colon cancer diagnosed in January 2018 with recurrence in November 2019. Patient states that he feels well  and has no complaints of pain. His last colonoscopy was done in February, 2022 by Dr. Bert and so will probably be repeated at 5 years. If Dr. Bert has not contacted him by next year regarding colonoscopy, then I will notify him. He has a WBC of 6.8, hemoglobin of 16.3, and low platelet count of 132,000 down from 141,000 within the last year. His CMP is normal other than an elevated fasting glucose of 153. His A1c in September, 2025 was elevated at 8.0. His CEA today is pending and I will call him with the results. He qualifies for our long-term survivorship clinic and he will return in 1 year with CBC, CMP, and CEA. I advised if he has any concerning symptoms or issues then to contact my office sooner. He denies fever, chills, night sweats, or other signs of infection. He denies cardiorespiratory and gastrointestinal issues. He  denies pain. His appetite is good and His weight has decreased 4 pounds over last year. He is currently taking Jardiance. This patient is accompanied in the office by his wife.    REVIEW OF SYSTEMS:  Review of Systems  Constitutional: Negative.  Negative for appetite change, chills, diaphoresis, fatigue, fever and unexpected weight change.  HENT:  Negative.  Negative for hearing loss, lump/mass, mouth sores, nosebleeds, sore throat, tinnitus, trouble swallowing and voice change.   Eyes: Negative.  Negative for eye problems and icterus.  Respiratory: Negative.  Negative for chest tightness, cough, hemoptysis, shortness of breath and wheezing.   Cardiovascular: Negative.  Negative for chest pain, leg swelling and palpitations.  Gastrointestinal: Negative.  Negative for abdominal distention, abdominal pain, blood in stool, constipation, diarrhea, nausea, rectal pain and vomiting.  Endocrine: Negative.  Negative for hot flashes.  Genitourinary: Negative.  Negative for bladder incontinence, difficulty urinating, dyspareunia, dysuria, frequency, hematuria, nocturia, pelvic pain  and penile discharge.   Musculoskeletal:  Positive for arthralgias. Negative for back pain, flank pain, gait problem, myalgias, neck pain and neck stiffness.  Skin: Negative.  Negative for itching, rash and wound.  Neurological:  Positive for numbness (hands and feet, improved). Negative for dizziness, extremity weakness, gait problem, headaches, light-headedness, seizures and speech difficulty.  Hematological: Negative.  Negative for adenopathy. Does not bruise/bleed easily.  Psychiatric/Behavioral: Negative.  Negative for confusion, decreased concentration, depression, sleep disturbance and suicidal ideas. The patient is not nervous/anxious.      VITALS:  Blood pressure 131/63, pulse (!) 58, temperature 97.6 F (36.4 C), temperature source Oral, resp. rate 16, height 5' 7 (1.702 m), weight 173 lb 9.6 oz (78.7 kg), SpO2 100%.  Wt Readings from Last 3 Encounters:  07/05/24 173 lb 9.6 oz (78.7 kg)  02/11/24 177 lb 3.2 oz (80.4 kg)  07/14/23 177 lb (80.3 kg)    Body mass index is 27.19 kg/m.  Performance status (ECOG): 0 - Asymptomatic  PHYSICAL EXAM:  Physical Exam Vitals and nursing note reviewed. Exam conducted with a chaperone present.  Constitutional:      General: He is not in acute distress.    Appearance: Normal appearance. He is normal  weight. He is not ill-appearing, toxic-appearing or diaphoretic.  HENT:     Head: Normocephalic and atraumatic.     Right Ear: Tympanic membrane, ear canal and external ear normal. There is no impacted cerumen.     Left Ear: Tympanic membrane, ear canal and external ear normal. There is no impacted cerumen.     Nose: Nose normal. No congestion or rhinorrhea.     Mouth/Throat:     Mouth: Mucous membranes are moist.     Pharynx: Oropharynx is clear. No oropharyngeal exudate or posterior oropharyngeal erythema.  Eyes:     General: No scleral icterus.       Right eye: No discharge.        Left eye: No discharge.     Extraocular Movements:  Extraocular movements intact.     Conjunctiva/sclera: Conjunctivae normal.     Pupils: Pupils are equal, round, and reactive to light.  Neck:     Vascular: No carotid bruit.  Cardiovascular:     Rate and Rhythm: Regular rhythm. Bradycardia present.     Pulses: Normal pulses.     Heart sounds: Normal heart sounds. No murmur heard.    No friction rub. No gallop.  Pulmonary:     Effort: Pulmonary effort is normal. No respiratory distress.     Breath sounds: Normal breath sounds. No stridor. No wheezing, rhonchi or rales.  Chest:     Chest wall: No tenderness.  Abdominal:     General: Bowel sounds are normal. There is no distension.     Palpations: Abdomen is soft. There is no hepatomegaly, splenomegaly or mass.     Tenderness: There is no abdominal tenderness. There is no right CVA tenderness, left CVA tenderness, guarding or rebound.     Hernia: No hernia is present.  Musculoskeletal:        General: No swelling, tenderness, deformity or signs of injury. Normal range of motion.     Cervical back: Normal range of motion and neck supple. No rigidity or tenderness.     Right lower leg: No edema.     Left lower leg: No edema.  Lymphadenopathy:     Cervical: No cervical adenopathy.     Upper Body:     Right upper body: No supraclavicular or axillary adenopathy.     Left upper body: No supraclavicular or axillary adenopathy.     Lower Body: No right inguinal adenopathy. No left inguinal adenopathy.  Skin:    General: Skin is warm and dry.     Coloration: Skin is not jaundiced or pale.     Findings: No bruising, erythema, lesion or rash.  Neurological:     General: No focal deficit present.     Mental Status: He is alert and oriented to person, place, and time. Mental status is at baseline.     Cranial Nerves: No cranial nerve deficit.     Sensory: No sensory deficit.     Motor: No weakness.     Coordination: Coordination normal.     Gait: Gait normal.     Deep Tendon Reflexes:  Reflexes normal.  Psychiatric:        Mood and Affect: Mood normal.        Behavior: Behavior normal.        Thought Content: Thought content normal.        Judgment: Judgment normal.     LABS:      Latest Ref Rng & Units 07/05/2024    8:39 AM  07/06/2023    9:15 AM 12/31/2022    8:38 AM  CBC  WBC 4.0 - 10.5 K/uL 6.8  8.9  7.2   Hemoglobin 13.0 - 17.0 g/dL 83.6  83.8  84.3   Hematocrit 39.0 - 52.0 % 50.6  49.1  49.4   Platelets 150 - 400 K/uL 132  141  148       Latest Ref Rng & Units 07/05/2024    8:39 AM 07/06/2023    9:15 AM 12/31/2022    8:38 AM  CMP  Glucose 70 - 99 mg/dL 846  819  840   BUN 8 - 23 mg/dL 13  16  17    Creatinine 0.61 - 1.24 mg/dL 8.92  8.95  8.89   Sodium 135 - 145 mmol/L 143  136  136   Potassium 3.5 - 5.1 mmol/L 4.7  4.4  4.6   Chloride 98 - 111 mmol/L 106  100  102   CO2 22 - 32 mmol/L 29  27  26    Calcium 8.9 - 10.3 mg/dL 9.7  9.3  9.2   Total Protein 6.5 - 8.1 g/dL 6.8  6.3  7.1   Total Bilirubin 0.0 - 1.2 mg/dL 0.9  0.7  0.9   Alkaline Phos 38 - 126 U/L 75  60  56   AST 15 - 41 U/L 27  17  22    ALT 0 - 44 U/L 30  27  32    Lab Results  Component Value Date   CEA1 2.1 12/31/2022   CEA 1.40 07/05/2024   /  CEA  Date Value Ref Range Status  12/31/2022 2.1 0.0 - 4.7 ng/mL Final    Comment:    (NOTE)                             Nonsmokers          <3.9                             Smokers             <5.6 [employer] Electrochemiluminescence Immunoassay (ECLIA) Values obtained with different assay methods or kits cannot be used interchangeably.  Results cannot be interpreted as absolute evidence of the presence or absence of malignant disease. Performed At: [facility] [address] [place], [place] [id number] Jennette Shorter MD Ey:[phone]    CEA ([facility])  Date Value Ref Range Status  07/05/2024 1.40 0.00 - 5.00 ng/mL Final    Comment:    (NOTE) This test was performed using [employer] Coulter's  paramagnetic chemiluminescent immunoassay. Values obtained from different assay methods cannot be used interchangeably. Please note that up to 8% of patients who smoke may see values 5.1-10.0 ng/ml and 1% of patients who smoke may see CEA levels >10.0 ng/ml. Performed at [facility], [address] [place], [place] [address]   No results found for: PSA1, PSA Lab Results  Component Value Date   TSH 1.73 02/24/2024   T4TOTAL 8.0 06/30/2022   STUDIES:  No studies found.   ASSESSMENT & PLAN:  Assessment:   1. History of stage IIIC colon cancer diagnosed in January 2018, treated with surgery and adjuvant FOLFOX chemotherapy. Last colonoscopy in 2022 was normal. It is recommended he repeat this in 5 years (2027).  2. Recurrent colon cancer in an abdominal lymph node in November 2019, which  was surgically resected.  As this was the only site of disease, palliative chemotherapy was not recommended.  He has been on observation for over 5 years.  CEA has remained normal.  CT abdomen in January 2024 done for abdominal pain and diarrhea did not reveal any evidence of malignancy.  CEA is pending from today.  3. Peripheral neuropathy due to chemotherapy.  Plan: His last colonoscopy was done in February, 2022 by Dr. Bert and so will probably be repeated at 5 years. If Dr. Bert has not contacted him by next year regarding colonoscopy, then I will notify him. He has a WBC of 6.8, hemoglobin of 16.3, and low platelet count of 132,000 down from 141,000 within the last year. His CMP is normal other than an elevated fasting glucose of 153. His A1c in September, 2025 was elevated at 8.0. His CEA today is pending and I will call him with the results. He qualifies for our long-term survivorship clinic and he will return in 1 year with CBC, CMP, and CEA. The patient and his wife understand the plans discussed today and are in agreement with them. They know to contact our office if  they develop concerns prior to his next appointment.  I provided 10 minutes of face-to-face time during this this encounter and > 50% was spent counseling as documented under my assessment and plan.   Wanda VEAR Cornish, MD [facility] A DE[facility] OF [facility] [address] [place] [address] Dept: [phone] Dept Fax: [phone]   No orders of the defined types were placed in this encounter.  I,Jasmine M Lassiter,acting as a scribe for Wanda VEAR Cornish, MD.,have documented all relevant documentation on the behalf of Wanda VEAR Cornish, MD,as directed by  Wanda VEAR Cornish, MD while in the presence of Wanda VEAR Cornish, MD.    "

## 2025-07-05 ENCOUNTER — Inpatient Hospital Stay: Admitting: Hematology and Oncology
